# Patient Record
Sex: Female | Born: 1960 | Marital: Married | Smoking: Never smoker
Health system: Southern US, Community
[De-identification: ages and names within clinical notes are randomized; demographics above are authoritative.]

## PROBLEM LIST (undated history)

## (undated) DIAGNOSIS — I10 Essential (primary) hypertension: Secondary | ICD-10-CM

## (undated) DIAGNOSIS — E079 Disorder of thyroid, unspecified: Secondary | ICD-10-CM

## (undated) DIAGNOSIS — D649 Anemia, unspecified: Secondary | ICD-10-CM

## (undated) DIAGNOSIS — E7800 Pure hypercholesterolemia, unspecified: Secondary | ICD-10-CM

## (undated) HISTORY — DX: Anemia, unspecified: D64.9

## (undated) HISTORY — DX: Essential (primary) hypertension: I10

## (undated) HISTORY — DX: Disorder of thyroid, unspecified: E07.9

## (undated) HISTORY — DX: Pure hypercholesterolemia, unspecified: E78.00

---

## 1986-12-13 HISTORY — PX: TUBAL LIGATION: SHX77

## 2014-02-25 DIAGNOSIS — E039 Hypothyroidism, unspecified: Secondary | ICD-10-CM | POA: Insufficient documentation

## 2014-02-25 DIAGNOSIS — E559 Vitamin D deficiency, unspecified: Secondary | ICD-10-CM | POA: Insufficient documentation

## 2014-02-25 DIAGNOSIS — E7800 Pure hypercholesterolemia, unspecified: Secondary | ICD-10-CM | POA: Insufficient documentation

## 2014-02-25 DIAGNOSIS — D509 Iron deficiency anemia, unspecified: Secondary | ICD-10-CM | POA: Insufficient documentation

## 2014-02-25 DIAGNOSIS — J45909 Unspecified asthma, uncomplicated: Secondary | ICD-10-CM | POA: Insufficient documentation

## 2014-02-25 DIAGNOSIS — I10 Essential (primary) hypertension: Secondary | ICD-10-CM | POA: Insufficient documentation

## 2014-07-29 DIAGNOSIS — R42 Dizziness and giddiness: Secondary | ICD-10-CM | POA: Insufficient documentation

## 2015-08-26 DIAGNOSIS — I951 Orthostatic hypotension: Secondary | ICD-10-CM | POA: Insufficient documentation

## 2016-04-20 DIAGNOSIS — M542 Cervicalgia: Secondary | ICD-10-CM | POA: Insufficient documentation

## 2016-05-03 DIAGNOSIS — M5030 Other cervical disc degeneration, unspecified cervical region: Secondary | ICD-10-CM | POA: Insufficient documentation

## 2016-11-02 DIAGNOSIS — N9089 Other specified noninflammatory disorders of vulva and perineum: Secondary | ICD-10-CM | POA: Insufficient documentation

## 2016-11-02 DIAGNOSIS — L739 Follicular disorder, unspecified: Secondary | ICD-10-CM | POA: Insufficient documentation

## 2016-11-02 DIAGNOSIS — R2689 Other abnormalities of gait and mobility: Secondary | ICD-10-CM | POA: Insufficient documentation

## 2019-08-22 ENCOUNTER — Other Ambulatory Visit: Payer: Self-pay | Admitting: Physician Assistant

## 2019-08-22 DIAGNOSIS — Z13820 Encounter for screening for osteoporosis: Secondary | ICD-10-CM

## 2019-08-22 DIAGNOSIS — Z1231 Encounter for screening mammogram for malignant neoplasm of breast: Secondary | ICD-10-CM

## 2019-10-02 ENCOUNTER — Ambulatory Visit
Admission: RE | Admit: 2019-10-02 | Discharge: 2019-10-02 | Disposition: A | Discharge status: Home / Self Care | Payer: Commercial Managed Care - PPO | Source: Ambulatory Visit | Attending: Physician Assistant | Admitting: Physician Assistant

## 2019-10-02 ENCOUNTER — Other Ambulatory Visit: Payer: Self-pay | Admitting: Physician Assistant

## 2019-10-02 DIAGNOSIS — Z13820 Encounter for screening for osteoporosis: Secondary | ICD-10-CM

## 2019-10-02 DIAGNOSIS — Z1231 Encounter for screening mammogram for malignant neoplasm of breast: Secondary | ICD-10-CM | POA: Diagnosis present

## 2019-10-04 ENCOUNTER — Inpatient Hospital Stay
Admission: RE | Admit: 2019-10-04 | Discharge: 2019-10-04 | Disposition: A | Discharge status: Home / Self Care | Payer: Self-pay | Source: Ambulatory Visit | Attending: *Deleted | Admitting: *Deleted

## 2019-10-04 ENCOUNTER — Other Ambulatory Visit: Payer: Self-pay | Admitting: Physician Assistant

## 2019-10-04 ENCOUNTER — Other Ambulatory Visit: Payer: Self-pay | Admitting: *Deleted

## 2019-10-04 DIAGNOSIS — Z1231 Encounter for screening mammogram for malignant neoplasm of breast: Secondary | ICD-10-CM

## 2019-10-04 DIAGNOSIS — N6489 Other specified disorders of breast: Secondary | ICD-10-CM

## 2019-10-04 DIAGNOSIS — R928 Other abnormal and inconclusive findings on diagnostic imaging of breast: Secondary | ICD-10-CM

## 2019-10-17 ENCOUNTER — Ambulatory Visit
Admission: RE | Admit: 2019-10-17 | Discharge: 2019-10-17 | Disposition: A | Discharge status: Home / Self Care | Payer: Commercial Managed Care - PPO | Source: Ambulatory Visit | Attending: Physician Assistant | Admitting: Physician Assistant

## 2019-10-17 DIAGNOSIS — N6489 Other specified disorders of breast: Secondary | ICD-10-CM | POA: Diagnosis present

## 2019-10-17 DIAGNOSIS — R928 Other abnormal and inconclusive findings on diagnostic imaging of breast: Secondary | ICD-10-CM | POA: Diagnosis not present

## 2019-10-19 ENCOUNTER — Ambulatory Visit: Payer: Commercial Managed Care - PPO

## 2019-10-19 ENCOUNTER — Other Ambulatory Visit: Payer: Commercial Managed Care - PPO

## 2020-02-28 ENCOUNTER — Ambulatory Visit: Payer: Commercial Managed Care - PPO | Attending: Internal Medicine

## 2020-02-28 DIAGNOSIS — Z23 Encounter for immunization: Secondary | ICD-10-CM

## 2020-02-28 NOTE — Progress Notes (Signed)
   Covid-19 Vaccination Clinic  Name:  Meredeth Mellema    MRN: [id number] DOB: 05/29/1961  02/28/2020  Ms. Haraway was observed post Covid-19 immunization for 15 minutes without incident. She was provided with Vaccine Information Sheet and instruction to access the V-Safe system.   Ms. Munns was instructed to call 911 with any severe reactions post vaccine: Marland Kitchen Difficulty breathing  . Swelling of face and throat  . A fast heartbeat  . A bad rash all over body  . Dizziness and weakness   Immunizations Administered    Name Date Dose VIS Date Route   Pfizer COVID-19 Vaccine 02/28/2020 10:52 AM 0.3 mL 11/23/2019 Intramuscular   Manufacturer: [place]   Lot: [id number]   [place]: [id number]

## 2020-03-25 ENCOUNTER — Ambulatory Visit: Payer: Commercial Managed Care - PPO | Attending: Internal Medicine

## 2020-03-25 DIAGNOSIS — Z23 Encounter for immunization: Secondary | ICD-10-CM

## 2020-03-25 NOTE — Progress Notes (Signed)
   Covid-19 Vaccination Clinic  Name:  Crystal Hunt    MRN: [id number] DOB: 07-24-1961  03/25/2020  [name] was observed post Covid-19 immunization for 15 minutes without incident. She was provided with Vaccine Information Sheet and instruction to access the V-Safe system.   [name] was instructed to call 911 with any severe reactions post vaccine: Marland Kitchen Difficulty breathing  . Swelling of face and throat  . A fast heartbeat  . A bad rash all over body  . Dizziness and weakness   Immunizations Administered    Name Date Dose VIS Date Route   Pfizer COVID-19 Vaccine 03/25/2020  8:52 AM 0.3 mL 11/23/2019 Intramuscular   Manufacturer: [place]   Lot: [id number]   [place]: [id number]

## 2020-07-18 ENCOUNTER — Other Ambulatory Visit: Payer: Self-pay

## 2020-07-21 ENCOUNTER — Other Ambulatory Visit: Payer: Self-pay

## 2020-07-21 ENCOUNTER — Ambulatory Visit (INDEPENDENT_AMBULATORY_CARE_PROVIDER_SITE_OTHER): Payer: Managed Care, Other (non HMO) | Admitting: Gastroenterology

## 2020-07-21 ENCOUNTER — Encounter: Payer: Self-pay | Admitting: Gastroenterology

## 2020-07-21 VITALS — BP 143/77 | HR 62 | Temp 97.5°F | Ht 62.0 in | Wt 131.4 lb

## 2020-07-21 DIAGNOSIS — D509 Iron deficiency anemia, unspecified: Secondary | ICD-10-CM

## 2020-07-21 DIAGNOSIS — K625 Hemorrhage of anus and rectum: Secondary | ICD-10-CM | POA: Diagnosis not present

## 2020-07-21 DIAGNOSIS — Z1211 Encounter for screening for malignant neoplasm of colon: Secondary | ICD-10-CM

## 2020-07-21 MED ORDER — NA SULFATE-K SULFATE-MG SULF 17.5-3.13-1.6 GM/177ML PO SOLN: ORAL | 0 refills | Status: AC

## 2020-07-21 NOTE — Progress Notes (Signed)
Crystal Hunt [address]  [place]  [place],  [address]  Main: [phone]  Fax: [phone]   Gastroenterology Consultation  Referring Provider:     Dayton Scrape* Primary Care Physician:  Joella Prince Reason for Consultation:    Blood in stool        HPI:    Chief Complaint  Patient presents with  . New Patient (Initial Visit)    Crystal Hunt is a 59 y.o. y/o female referred for consultation & management  by Dr. Justin Mend, Meghan H, PA-C.  Patient noted bright red blood per rectum, on toilet paper and blood streaks in the stool itself 2-3 times.  States has had previous colonoscopies, last one was over 10 years ago.  States has had 2 or 3 colonoscopies in her lifetime and only one of them small polyps were removed.  Procedure report not available.  No family history of colon cancer.  No abdominal pain or nausea vomiting.  Patient is on iron replacement.  Lab work shows microcytic anemia but looking at her labs dating back to 2012, her ferritin and iron levels do not show iron deficiency.  Past medical history: Hypertension, hypothyroidism  Prior to Admission medications   Medication Sig Start Date End Date Taking? Authorizing Provider  ergocalciferol (VITAMIN D2) 1.25 MG (50000 UT) capsule Take 1 capsule by mouth once a week. 11/18/16  Yes [provider]  ferrous gluconate (FERGON) 324 MG tablet Take 1 tablet by mouth daily with breakfast. 07/26/19 07/25/20 Yes [provider]  levothyroxine (SYNTHROID) 88 MCG tablet Take 88 mcg by mouth daily. 07/16/20  Yes [provider]  lisinopril (ZESTRIL) 10 MG tablet Take 1 tablet by mouth daily. 07/26/19  Yes [provider]  simvastatin (ZOCOR) 40 MG tablet Take 1 tablet by mouth at bedtime. 10/24/19  Yes [provider]  Na Sulfate-K Sulfate-Mg Sulf 17.5-3.13-1.6 GM/177ML SOLN At 5 PM the day before procedure take 1 bottle and 5 hours before procedure  take 1 bottle. 07/21/20   Virgel Manifold, MD    Family History  Problem Relation Age of Onset  . Breast cancer Sister      Social History   Tobacco Use  . Smoking status: Never Smoker  . Smokeless tobacco: Never Used  Substance Use Topics  . Alcohol use: Not Currently  . Drug use: Not on file    Allergies as of 07/21/2020 - Review Complete 07/21/2020  Allergen Reaction Noted  . Hydrocodone-acetaminophen Itching and Rash 06/27/2014  . Meloxicam Rash 06/27/2014  . Simvastatin  09/30/2015    Review of Systems:    All systems reviewed and negative except where noted in HPI.   Physical Exam:  BP (!) 143/77   Pulse 62   Temp (!) 97.5 F (36.4 C) (Oral)   Ht 5\' 2"  (1.575 m)   Wt 131 lb 6.4 oz (59.6 kg)   BMI 24.03 kg/m  No LMP recorded. Patient is postmenopausal. Psych:  Alert and cooperative. Normal mood and affect. General:   Alert,  Well-developed, well-nourished, pleasant and cooperative in NAD Head:  Normocephalic and atraumatic. Eyes:  Sclera clear, no icterus.   Conjunctiva pink. Ears:  Normal auditory acuity. Nose:  No deformity, discharge, or lesions. Mouth:  No deformity or lesions,oropharynx pink & moist. Neck:  Supple; no masses or thyromegaly. Abdomen:  Normal bowel sounds.  No bruits.  Soft, non-tender and non-distended without masses, hepatosplenomegaly or hernias noted.  No guarding or  rebound tenderness.    Msk:  Symmetrical without gross deformities. Good, equal movement & strength bilaterally. Pulses:  Normal pulses noted. Extremities:  No clubbing or edema.  No cyanosis. Neurologic:  Alert and oriented x3;  grossly normal neurologically. Skin:  Intact without significant lesions or rashes. No jaundice. Lymph Nodes:  No significant cervical adenopathy. Psych:  Alert and cooperative. Normal mood and affect.   Labs: Labs in [place] from June 2021 reviewed  Imaging Studies: No results found.  Assessment and Plan:   Crystal Hunt is a 59 y.o. y/o female has been referred for blood per rectum  Symptoms likely due to underlying internal hemorrhoids  However, patient is due for screening colonoscopy which would also allow [place] to evaluate for any underlying lesions, large polyps and rule out malignancy  Her ferritin and iron labs do not show iron deficiency but patient continues to have chronic microcytic anemia.  Referral to hematology for the same, patient agreeable  High fiber diet  I have discussed alternative options, risks & benefits,  which include, but are not limited to, bleeding, infection, perforation,respiratory complication & drug reaction.  The patient agrees with this plan & written consent will be obtained.     Dr Crystal Hunt  Speech recognition software was used to dictate the above note.

## 2020-07-24 ENCOUNTER — Other Ambulatory Visit: Payer: Self-pay

## 2020-07-24 DIAGNOSIS — Z1211 Encounter for screening for malignant neoplasm of colon: Secondary | ICD-10-CM

## 2020-08-04 ENCOUNTER — Other Ambulatory Visit: Payer: Self-pay

## 2020-08-04 ENCOUNTER — Encounter: Payer: Self-pay | Admitting: Oncology

## 2020-08-04 ENCOUNTER — Inpatient Hospital Stay: Payer: Managed Care, Other (non HMO) | Attending: Oncology | Admitting: Oncology

## 2020-08-04 ENCOUNTER — Inpatient Hospital Stay: Payer: Managed Care, Other (non HMO)

## 2020-08-04 VITALS — BP 123/66 | HR 60 | Temp 98.4°F | Resp 16 | Ht 62.0 in | Wt 131.7 lb

## 2020-08-04 DIAGNOSIS — Z803 Family history of malignant neoplasm of breast: Secondary | ICD-10-CM | POA: Insufficient documentation

## 2020-08-04 DIAGNOSIS — E559 Vitamin D deficiency, unspecified: Secondary | ICD-10-CM | POA: Insufficient documentation

## 2020-08-04 DIAGNOSIS — I10 Essential (primary) hypertension: Secondary | ICD-10-CM

## 2020-08-04 DIAGNOSIS — Z79899 Other long term (current) drug therapy: Secondary | ICD-10-CM | POA: Insufficient documentation

## 2020-08-04 DIAGNOSIS — D509 Iron deficiency anemia, unspecified: Secondary | ICD-10-CM | POA: Diagnosis not present

## 2020-08-04 DIAGNOSIS — E039 Hypothyroidism, unspecified: Secondary | ICD-10-CM | POA: Diagnosis not present

## 2020-08-04 LAB — CBC WITH DIFFERENTIAL/PLATELET
Abs Immature Granulocytes: 0.02 10*3/uL (ref 0.00–0.07)
Basophils Absolute: 0 10*3/uL (ref 0.0–0.1)
Basophils Relative: 1 %
Eosinophils Absolute: 0.1 10*3/uL (ref 0.0–0.5)
Eosinophils Relative: 2 %
HCT: 38.8 % (ref 36.0–46.0)
Hemoglobin: 12 g/dL (ref 12.0–15.0)
Immature Granulocytes: 0 %
Lymphocytes Relative: 37 %
Lymphs Abs: 2 10*3/uL (ref 0.7–4.0)
MCH: 21 pg — ABNORMAL LOW (ref 26.0–34.0)
MCHC: 30.9 g/dL (ref 30.0–36.0)
MCV: 68 fL — ABNORMAL LOW (ref 80.0–100.0)
Monocytes Absolute: 0.4 10*3/uL (ref 0.1–1.0)
Monocytes Relative: 8 %
Neutro Abs: 2.8 10*3/uL (ref 1.7–7.7)
Neutrophils Relative %: 52 %
Platelets: 273 10*3/uL (ref 150–400)
RBC: 5.71 MIL/uL — ABNORMAL HIGH (ref 3.87–5.11)
RDW: 14.7 % (ref 11.5–15.5)
WBC: 5.3 10*3/uL (ref 4.0–10.5)
nRBC: 0 % (ref 0.0–0.2)

## 2020-08-04 LAB — IRON AND TIBC
Iron: 82 ug/dL (ref 28–170)
Saturation Ratios: 25 % (ref 10.4–31.8)
TIBC: 328 ug/dL (ref 250–450)
UIBC: 246 ug/dL

## 2020-08-04 LAB — VITAMIN B12: Vitamin B-12: 487 pg/mL (ref 180–914)

## 2020-08-04 LAB — FERRITIN: Ferritin: 263 ng/mL (ref 11–307)

## 2020-08-04 NOTE — Progress Notes (Signed)
Hematology/Oncology Consult note [facility] [phone] [phone]  Patient Care Team: Joella Prince as PCP - General (Physician Assistant)   Name of the patient: Crystal Hunt  [id number]  December 26, 1960    Reason for referral-microcytic anemia   Referring physician-Dr. Bonna Gains  Date of visit: 08/04/20   History of presenting illness-patient is a 59 year old female with a past medical history significant for hypothyroidism, hyperlipidemia who has been referred to [place] for anemia.  Her most recent CBC From 05/28/2020 showed white count of 5.2, H&H of 11.7/38.5 with an MCV of 68.8 and a platelet count of 295.  Looking back at her prior CBCs over the last 5 years her hemoglobin has been between 11-12 and she has had persistent microcytosis with an MCV between 68-69.  Last iron studies checked was back in 2017 when they were normal.  No family history of sickle cell disease or thalassemia.  Patient denies any bleeding in her stool or urine.  She has also seen GI and will be undergoing colonoscopy soon.    ECOG PS- 0  Pain scale- 0   Review of systems- Review of Systems  Constitutional: Positive for malaise/fatigue. Negative for chills, fever and weight loss.  HENT: Negative for congestion, ear discharge and nosebleeds.   Eyes: Negative for blurred vision.  Respiratory: Negative for cough, hemoptysis, sputum production, shortness of breath and wheezing.   Cardiovascular: Negative for chest pain, palpitations, orthopnea and claudication.  Gastrointestinal: Negative for abdominal pain, blood in stool, constipation, diarrhea, heartburn, melena, nausea and vomiting.  Genitourinary: Negative for dysuria, flank pain, frequency, hematuria and urgency.  Musculoskeletal: Negative for back pain, joint pain and myalgias.  Skin: Negative for rash.  Neurological: Negative for dizziness, tingling, focal weakness, seizures, weakness and headaches.    Endo/Heme/Allergies: Does not bruise/bleed easily.  Psychiatric/Behavioral: Negative for depression and suicidal ideas. The patient does not have insomnia.     Allergies  Allergen Reactions  . Hydrocodone-Acetaminophen Itching and Rash  . Meloxicam Rash  . Ibuprofen     Passed out when she took it, she had a fever around 101  . Simvastatin     Other reaction(s): Other (See Comments) Possible cause of elevated LFTs    Patient Active Problem List   Diagnosis Date Noted  . Folliculitis 06/05/7627  . Imbalance 11/02/2016  . Labial lesion 11/02/2016  . Degenerative disc disease, cervical 05/03/2016  . Neck pain 04/20/2016  . Orthostatic hypotension 08/26/2015  . Dizziness 07/29/2014  . Acquired hypothyroidism 02/25/2014  . Asthma 02/25/2014  . Essential hypertension 02/25/2014  . Hypercholesterolemia 02/25/2014  . Iron deficiency anemia 02/25/2014  . Vitamin D deficiency 02/25/2014  . Onychomycosis due to dermatophyte 07/26/2013  . Benign neoplasm of colon 05/19/2013  . Internal hemorrhoids 05/19/2013  . Backache 04/28/2012     Past Medical History:  Diagnosis Date  . Anemia   . High cholesterol   . Hypertension   . Thyroid disease      Past Surgical History:  Procedure Laterality Date  . TUBAL LIGATION  1988   bilateral    Social History   Socioeconomic History  . Marital status: Unknown    Spouse name: Not on file  . Number of children: Not on file  . Years of education: Not on file  . Highest education level: Not on file  Occupational History  . Not on file  Tobacco Use  . Smoking status: Never Smoker  . Smokeless tobacco: Never Used  Vaping  Use  . Vaping Use: Never used  Substance and Sexual Activity  . Alcohol use: Never  . Drug use: Never  . Sexual activity: Yes  Other Topics Concern  . Not on file  Social History Narrative  . Not on file   Social Determinants of Health   Financial Resource Strain:   . Difficulty of Paying Living  Expenses: Not on file  Food Insecurity:   . Worried About Charity fundraiser in the Last Year: Not on file  . Ran Out of Food in the Last Year: Not on file  Transportation Needs:   . Lack of Transportation (Medical): Not on file  . Lack of Transportation (Non-Medical): Not on file  Physical Activity:   . Days of Exercise per Week: Not on file  . Minutes of Exercise per Session: Not on file  Stress:   . Feeling of Stress : Not on file  Social Connections:   . Frequency of Communication with Friends and Family: Not on file  . Frequency of Social Gatherings with Friends and Family: Not on file  . Attends Religious Services: Not on file  . Active Member of Clubs or Organizations: Not on file  . Attends Archivist Meetings: Not on file  . Marital Status: Not on file  Intimate Partner Violence:   . Fear of Current or Ex-Partner: Not on file  . Emotionally Abused: Not on file  . Physically Abused: Not on file  . Sexually Abused: Not on file     Family History  Problem Relation Age of Onset  . Breast cancer Sister   . Clotting disorder Sister   . Heart disease Mother   . High Cholesterol Mother   . Hypertension Mother   . Heart attack Brother   . Diabetes Daughter   . Diabetes Son      Current Outpatient Medications:  .  ergocalciferol (VITAMIN D2) 1.25 MG (50000 UT) capsule, Take 1 capsule by mouth once a week., Disp: , Rfl:  .  ferrous gluconate (FERGON) 324 MG tablet, Take 1 tablet by mouth 3 (three) times a week. , Disp: , Rfl:  .  levothyroxine (SYNTHROID) 88 MCG tablet, Take 88 mcg by mouth daily., Disp: , Rfl:  .  lisinopril (ZESTRIL) 10 MG tablet, Take 1 tablet by mouth daily., Disp: , Rfl:  .  simvastatin (ZOCOR) 40 MG tablet, Take 1 tablet by mouth at bedtime., Disp: , Rfl:  .  Na Sulfate-K Sulfate-Mg Sulf 17.5-3.13-1.6 GM/177ML SOLN, At 5 PM the day before procedure take 1 bottle and 5 hours before procedure take 1 bottle. (Patient not taking: Reported on  08/04/2020), Disp: 354 mL, Rfl: 0   Physical exam:  Vitals:   08/04/20 0959  BP: 123/66  Pulse: 60  Resp: 16  Temp: 98.4 F (36.9 C)  TempSrc: Oral  Weight: 131 lb 11.2 oz (59.7 kg)  Height: 5\' 2"  (1.575 m)   Physical Exam Constitutional:      General: She is not in acute distress. Cardiovascular:     Rate and Rhythm: Normal rate and regular rhythm.     Heart sounds: Normal heart sounds.  Pulmonary:     Effort: Pulmonary effort is normal.     Breath sounds: Normal breath sounds.  Abdominal:     General: Bowel sounds are normal.     Palpations: Abdomen is soft.  Skin:    General: Skin is warm and dry.  Neurological:     Mental Status: She  is alert and oriented to person, place, and time.      Assessment and plan- Patient is a 59 y.o. female referred for microcytic anemia  Patient has had longstanding microcytic anemia which is relatively mild as her hemoglobin is remained stable around 11 with an MCV around 68.  Microcytic anemia could be either due to iron deficiency versus conditions like thalassemia versus idiopathic causes.  Today I will check a CBC with differential, ferritin and iron studies,Hemoglobin electrophoresis as well as B12 levels.  I will see her back in 1 to 2 weeks time for a video visit to discuss the results of blood work and further management   Thank you for this kind referral and the opportunity to participate in the care of this patient   Visit Diagnosis 1. Microcytic anemia     Dr. Randa Evens, MD, MPH [facility] at [facility] [phone] 08/04/2020 1:44 PM

## 2020-08-04 NOTE — Progress Notes (Signed)
Pt shceduled for colonoscopy in next month- she had some blood in stool in June. 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 No abd. Pain , and bm is good.

## 2020-08-05 ENCOUNTER — Encounter: Admission: RE | Payer: Self-pay | Source: Home / Self Care

## 2020-08-05 ENCOUNTER — Ambulatory Visit
Admission: RE | Admit: 2020-08-05 | Payer: Managed Care, Other (non HMO) | Source: Home / Self Care | Admitting: Gastroenterology

## 2020-08-05 SURGERY — COLONOSCOPY WITH PROPOFOL
Anesthesia: Choice

## 2020-08-06 LAB — HGB FRACTIONATION CASCADE
Hgb A2: 2.3 % (ref 1.8–3.2)
Hgb A: 97.7 % (ref 96.4–98.8)
Hgb F: 0 % (ref 0.0–2.0)
Hgb S: 0 %

## 2020-08-13 ENCOUNTER — Other Ambulatory Visit
Admission: RE | Admit: 2020-08-13 | Discharge: 2020-08-13 | Disposition: A | Discharge status: Home / Self Care | Payer: Managed Care, Other (non HMO) | Source: Ambulatory Visit | Attending: Gastroenterology | Admitting: Gastroenterology

## 2020-08-13 ENCOUNTER — Other Ambulatory Visit: Payer: Self-pay

## 2020-08-13 DIAGNOSIS — Z01812 Encounter for preprocedural laboratory examination: Secondary | ICD-10-CM | POA: Insufficient documentation

## 2020-08-13 DIAGNOSIS — Z20822 Contact with and (suspected) exposure to covid-19: Secondary | ICD-10-CM | POA: Diagnosis not present

## 2020-08-13 LAB — SARS CORONAVIRUS 2 (TAT 6-24 HRS): SARS Coronavirus 2: NEGATIVE

## 2020-08-15 ENCOUNTER — Ambulatory Visit: Payer: Managed Care, Other (non HMO) | Admitting: Anesthesiology

## 2020-08-15 ENCOUNTER — Other Ambulatory Visit: Payer: Self-pay

## 2020-08-15 ENCOUNTER — Ambulatory Visit
Admission: RE | Admit: 2020-08-15 | Discharge: 2020-08-15 | Disposition: A | Discharge status: Home / Self Care | Payer: Managed Care, Other (non HMO) | Attending: Gastroenterology | Admitting: Gastroenterology

## 2020-08-15 ENCOUNTER — Encounter: Payer: Self-pay | Admitting: Gastroenterology

## 2020-08-15 ENCOUNTER — Encounter
Admission: RE | Disposition: A | Discharge status: Home / Self Care | Payer: Self-pay | Source: Home / Self Care | Attending: Gastroenterology

## 2020-08-15 DIAGNOSIS — Z8249 Family history of ischemic heart disease and other diseases of the circulatory system: Secondary | ICD-10-CM | POA: Insufficient documentation

## 2020-08-15 DIAGNOSIS — Z1211 Encounter for screening for malignant neoplasm of colon: Secondary | ICD-10-CM | POA: Diagnosis present

## 2020-08-15 DIAGNOSIS — Z885 Allergy status to narcotic agent status: Secondary | ICD-10-CM | POA: Insufficient documentation

## 2020-08-15 DIAGNOSIS — D125 Benign neoplasm of sigmoid colon: Secondary | ICD-10-CM | POA: Insufficient documentation

## 2020-08-15 DIAGNOSIS — D649 Anemia, unspecified: Secondary | ICD-10-CM | POA: Insufficient documentation

## 2020-08-15 DIAGNOSIS — Z79890 Hormone replacement therapy (postmenopausal): Secondary | ICD-10-CM | POA: Diagnosis not present

## 2020-08-15 DIAGNOSIS — Z8349 Family history of other endocrine, nutritional and metabolic diseases: Secondary | ICD-10-CM | POA: Insufficient documentation

## 2020-08-15 DIAGNOSIS — I10 Essential (primary) hypertension: Secondary | ICD-10-CM | POA: Diagnosis not present

## 2020-08-15 DIAGNOSIS — K649 Unspecified hemorrhoids: Secondary | ICD-10-CM | POA: Insufficient documentation

## 2020-08-15 DIAGNOSIS — Z888 Allergy status to other drugs, medicaments and biological substances status: Secondary | ICD-10-CM | POA: Insufficient documentation

## 2020-08-15 DIAGNOSIS — K635 Polyp of colon: Secondary | ICD-10-CM

## 2020-08-15 DIAGNOSIS — Z886 Allergy status to analgesic agent status: Secondary | ICD-10-CM | POA: Insufficient documentation

## 2020-08-15 DIAGNOSIS — E079 Disorder of thyroid, unspecified: Secondary | ICD-10-CM | POA: Insufficient documentation

## 2020-08-15 DIAGNOSIS — Z79899 Other long term (current) drug therapy: Secondary | ICD-10-CM | POA: Diagnosis not present

## 2020-08-15 DIAGNOSIS — E7800 Pure hypercholesterolemia, unspecified: Secondary | ICD-10-CM | POA: Diagnosis not present

## 2020-08-15 HISTORY — PX: COLONOSCOPY WITH PROPOFOL: SHX5780

## 2020-08-15 SURGERY — COLONOSCOPY WITH PROPOFOL
Anesthesia: General

## 2020-08-15 MED ORDER — PROPOFOL 10 MG/ML IV BOLUS
INTRAVENOUS | Status: DC | PRN
  Administered 2020-08-15: 50 mg via INTRAVENOUS
  Administered 2020-08-15: 25 mg via INTRAVENOUS
  Administered 2020-08-15: 50 mg via INTRAVENOUS

## 2020-08-15 MED ORDER — LIDOCAINE HCL (PF) 2 % IJ SOLN
INTRAMUSCULAR | Status: AC
  Filled 2020-08-15: qty 5

## 2020-08-15 MED ORDER — PROPOFOL 500 MG/50ML IV EMUL
INTRAVENOUS | Status: DC | PRN
  Administered 2020-08-15: 125 ug/kg/min via INTRAVENOUS

## 2020-08-15 MED ORDER — PROPOFOL 500 MG/50ML IV EMUL
INTRAVENOUS | Status: AC
  Filled 2020-08-15: qty 50

## 2020-08-15 MED ORDER — SODIUM CHLORIDE 0.9 % IV SOLN: INTRAVENOUS | Status: DC

## 2020-08-15 MED ORDER — LIDOCAINE HCL (CARDIAC) PF 100 MG/5ML IV SOSY
PREFILLED_SYRINGE | INTRAVENOUS | Status: DC | PRN
  Administered 2020-08-15: 20 mg via INTRAVENOUS

## 2020-08-15 NOTE — H&P (Signed)
Vonda Antigua, MD [address], [place], [place], [place], [address] [address], [place], [place], [place], [address] Phone: [phone]  Fax: [phone]  Primary Care Physician:  Curt Jews, PA-C   Pre-Procedure History & Physical: HPI:  Crystal Hunt is a 59 y.o. female is here for a colonoscopy.   Past Medical History:  Diagnosis Date  . Anemia   . High cholesterol   . Hypertension   . Thyroid disease     Past Surgical History:  Procedure Laterality Date  . TUBAL LIGATION  1988   bilateral    Prior to Admission medications   Medication Sig Start Date End Date Taking? Authorizing Provider  levothyroxine (SYNTHROID) 88 MCG tablet Take 88 mcg by mouth daily. 07/16/20  Yes [provider]  lisinopril (ZESTRIL) 10 MG tablet Take 1 tablet by mouth daily. 07/26/19  Yes [provider]  simvastatin (ZOCOR) 40 MG tablet Take 1 tablet by mouth at bedtime. 10/24/19  Yes [provider]  ergocalciferol (VITAMIN D2) 1.25 MG (50000 UT) capsule Take 1 capsule by mouth once a week. 11/18/16   [provider]  ferrous gluconate (FERGON) 324 MG tablet Take 1 tablet by mouth 3 (three) times a week.  07/26/19 08/04/20  [provider]  Na Sulfate-K Sulfate-Mg Sulf 17.5-3.13-1.6 GM/177ML SOLN At 5 PM the day before procedure take 1 bottle and 5 hours before procedure take 1 bottle. Patient not taking: Reported on 08/04/2020 07/21/20   Virgel Manifold, MD    Allergies as of 07/24/2020 - Review Complete 07/21/2020  Allergen Reaction Noted  . Hydrocodone-acetaminophen Itching and Rash 06/27/2014  . Meloxicam Rash 06/27/2014  . Simvastatin  09/30/2015    Family History  Problem Relation Age of Onset  . Breast cancer Sister   . Clotting disorder Sister   . Heart disease Mother   . High Cholesterol Mother   . Hypertension Mother   . Heart attack Brother   . Diabetes Daughter   . Diabetes Son     Social History    Socioeconomic History  . Marital status: Married    Spouse name: Not on file  . Number of children: Not on file  . Years of education: Not on file  . Highest education level: Not on file  Occupational History  . Not on file  Tobacco Use  . Smoking status: Never Smoker  . Smokeless tobacco: Never Used  Vaping Use  . Vaping Use: Never used  Substance and Sexual Activity  . Alcohol use: Never  . Drug use: Never  . Sexual activity: Yes  Other Topics Concern  . Not on file  Social History Narrative  . Not on file   Social Determinants of Health   Financial Resource Strain:   . Difficulty of Paying Living Expenses: Not on file  Food Insecurity:   . Worried About Charity fundraiser in the Last Year: Not on file  . Ran Out of Food in the Last Year: Not on file  Transportation Needs:   . Lack of Transportation (Medical): Not on file  . Lack of Transportation (Non-Medical): Not on file  Physical Activity:   . Days of Exercise per Week: Not on file  . Minutes of Exercise per Session: Not on file  Stress:   . Feeling of Stress : Not on file  Social Connections:   . Frequency of Communication with Friends and Family: Not on file  . Frequency of Social Gatherings with Friends  and Family: Not on file  . Attends Religious Services: Not on file  . Active Member of Clubs or Organizations: Not on file  . Attends Archivist Meetings: Not on file  . Marital Status: Not on file  Intimate Partner Violence:   . Fear of Current or Ex-Partner: Not on file  . Emotionally Abused: Not on file  . Physically Abused: Not on file  . Sexually Abused: Not on file    Review of Systems: See HPI, otherwise negative ROS  Physical Exam: BP (!) 144/71   Pulse (!) 58   Temp 97.9 F (36.6 C) (Temporal)   Resp 16   Ht 5\' 2"  (1.575 m)   Wt 58.1 kg   SpO2 100%   BMI 23.41 kg/m  General:   Alert,  pleasant and cooperative in NAD Head:  Normocephalic and atraumatic. Neck:  Supple;  no masses or thyromegaly. Lungs:  Clear throughout to auscultation, normal respiratory effort.    Heart:  +S1, +S2, Regular rate and rhythm, No edema. Abdomen:  Soft, nontender and nondistended. Normal bowel sounds, without guarding, and without rebound.   Neurologic:  Alert and  oriented x4;  grossly normal neurologically.  Impression/Plan: Crystal Hunt is here for a colonoscopy to be performed for average risk screening.  Risks, benefits, limitations, and alternatives regarding  colonoscopy have been reviewed with the patient.  Questions have been answered.  All parties agreeable.   Virgel Manifold, MD  08/15/2020, 10:09 AM

## 2020-08-15 NOTE — Anesthesia Postprocedure Evaluation (Signed)
Anesthesia Post Note  Patient: Crystal Hunt  Procedure(s) Performed: COLONOSCOPY WITH PROPOFOL (N/A )  Patient location during evaluation: Endoscopy Anesthesia Type: General Level of consciousness: awake and alert Pain management: pain level controlled Vital Signs Assessment: post-procedure vital signs reviewed and stable Respiratory status: spontaneous breathing and respiratory function stable Cardiovascular status: stable Anesthetic complications: no   No complications documented.   Last Vitals:  Vitals:   08/15/20 1059 08/15/20 1109  BP: 119/64 126/64  Pulse: (!) 57 (!) 50  Resp: 18 16  Temp:    SpO2: 100% 100%    Last Pain:  Vitals:   08/15/20 1109  TempSrc:   PainSc: 0-No pain                 Sojourner Behringer K

## 2020-08-15 NOTE — Anesthesia Preprocedure Evaluation (Signed)
Anesthesia Evaluation  Patient identified by MRN, date of birth, ID band Patient awake    Reviewed: Allergy & Precautions, NPO status , Patient's Chart, lab work & pertinent test results  History of Anesthesia Complications Negative for: history of anesthetic complications  Airway Mallampati: II       Dental   Pulmonary neg sleep apnea, neg COPD, Not current smoker,           Cardiovascular hypertension, Pt. on medications (-) Past MI and (-) CHF (-) dysrhythmias (-) Valvular Problems/Murmurs     Neuro/Psych neg Seizures    GI/Hepatic Neg liver ROS, neg GERD  ,  Endo/Other  neg diabetesHypothyroidism   Renal/GU negative Renal ROS     Musculoskeletal   Abdominal   Peds  Hematology  (+) anemia ,   Anesthesia Other Findings   Reproductive/Obstetrics                            Anesthesia Physical Anesthesia Plan  ASA: II  Anesthesia Plan: General   Post-op Pain Management:    Induction: Intravenous  PONV Risk Score and Plan: 3 and Propofol infusion, TIVA and Treatment may vary due to age or medical condition  Airway Management Planned: Nasal Cannula  Additional Equipment:   Intra-op Plan:   Post-operative Plan:   Informed Consent: I have reviewed the patients History and Physical, chart, labs and discussed the procedure including the risks, benefits and alternatives for the proposed anesthesia with the patient or authorized representative who has indicated his/her understanding and acceptance.       Plan Discussed with:   Anesthesia Plan Comments:         Anesthesia Quick Evaluation

## 2020-08-15 NOTE — Transfer of Care (Signed)
Immediate Anesthesia Transfer of Care Note  Patient: Crystal Hunt  Procedure(s) Performed: COLONOSCOPY WITH PROPOFOL (N/A )  Patient Location: PACU and Endoscopy Unit  Anesthesia Type:General  Level of Consciousness: awake  Airway & Oxygen Therapy: Patient Spontanous Breathing  Post-op Assessment: Report given to RN  Post vital signs: stable  Last Vitals:  Vitals Value Taken Time  BP    Temp    Pulse    Resp    SpO2      Last Pain:  Vitals:   08/15/20 0955  TempSrc: Temporal         Complications: No complications documented.

## 2020-08-15 NOTE — Op Note (Signed)
[facility] Patient Name: Crystal Hunt Procedure Date: 08/15/2020 10:16 AM MRN: [id number] Account #: [id number] Date of Birth: 08/23/1961 Admit Type: Outpatient Age: 59 Room: [facility] ENDO ROOM 2 Gender: Female Note Status: Finalized Procedure:             Colonoscopy Indications:           Screening for colorectal malignant neoplasm,                         Incidental - Diarrhea Providers:             Aleila Syverson B. Bonna Gains MD, MD Referring MD:          Forest Gleason Md, MD (Referring MD) Medicines:             Monitored Anesthesia Care Complications:         No immediate complications. Procedure:             Pre-Anesthesia Assessment:                        - ASA Grade Assessment: II - A patient with mild                         systemic disease.                        - Prior to the procedure, a History and Physical was                         performed, and patient medications, allergies and                         sensitivities were reviewed. The patient's tolerance                         of previous anesthesia was reviewed.                        - The risks and benefits of the procedure and the                         sedation options and risks were discussed with the                         patient. All questions were answered and informed                         consent was obtained.                        - Patient identification and proposed procedure were                         verified prior to the procedure by the physician, the                         nurse, the anesthesiologist, the anesthetist and the                         technician. The procedure was verified  in the                         procedure room.                        After obtaining informed consent, the colonoscope was                         passed under direct vision. Throughout the procedure,                         the patient's blood pressure, pulse, and oxygen                          saturations were monitored continuously. The                         Colonoscope was introduced through the anus and                         advanced to the the cecum, identified by appendiceal                         orifice and ileocecal valve. The colonoscopy was                         performed with ease. The patient tolerated the                         procedure well. The quality of the bowel preparation                         was fair. Findings:      Hemorrhoids were found on perianal exam.      A 6 mm polyp was found in the sigmoid colon. The polyp was sessile. The       polyp was removed with a cold snare. Resection and retrieval were       complete.      The exam was otherwise without abnormality.      The rectum, sigmoid colon, descending colon, transverse colon, ascending       colon and cecum appeared normal. Biopsies for histology were taken with       a cold forceps from the entire colon for evaluation of microscopic       colitis.      The retroflexed view of the distal rectum and anal verge was normal and       showed no anal or rectal abnormalities.      Anal papilla(e) were hypertrophied.      No additional abnormalities were found on retroflexion. Impression:            - Preparation of the colon was fair.                        - Hemorrhoids found on perianal exam.                        - One 6 mm polyp in the sigmoid colon, removed with a  cold snare. Resected and retrieved.                        - The examination was otherwise normal.                        - The rectum, sigmoid colon, descending colon,                         transverse colon, ascending colon and cecum are                         normal. Biopsied.                        - The distal rectum and anal verge are normal on                         retroflexion view.                        - Anal papilla(e) were hypertrophied. Recommendation:         - Await pathology results.                        - Discharge patient to home (with escort).                        - Advance diet as tolerated.                        - Continue present medications.                        - Repeat colonoscopy in 2 years, with 2 day prep.                        - The findings and recommendations were discussed with                         the patient.                        - The findings and recommendations were discussed with                         the patient's family.                        - Return to primary care physician as previously                         scheduled. Procedure Code(s):     --- Professional ---                        [phone], Colonoscopy, flexible; with removal of                         tumor(s), polyp(s), or other lesion(s) by snare  technique                        45380, 59, Colonoscopy, flexible; with biopsy, single                         or multiple CPT copyright 2019 American Medical Association. All rights reserved. The codes documented in this report are preliminary and upon coder review may  be revised to meet current compliance requirements.  Vonda Antigua, MD Margretta Sidle B. Bonna Gains MD, MD 08/15/2020 10:55:58 AM This report has been signed electronically. Number of Addenda: 0 Note Initiated On: 08/15/2020 10:16 AM Scope Withdrawal Time: 0 hours 10 minutes 31 seconds  Total Procedure Duration: 0 hours 15 minutes 19 seconds  Estimated Blood Loss:  Estimated blood loss: none.      [facility]

## 2020-08-17 ENCOUNTER — Encounter: Payer: Self-pay | Admitting: Gastroenterology

## 2020-08-19 ENCOUNTER — Inpatient Hospital Stay: Payer: Managed Care, Other (non HMO) | Attending: Oncology | Admitting: Oncology

## 2020-08-19 ENCOUNTER — Encounter: Payer: Self-pay | Admitting: Gastroenterology

## 2020-08-19 ENCOUNTER — Other Ambulatory Visit: Payer: Self-pay | Admitting: *Deleted

## 2020-08-19 DIAGNOSIS — D509 Iron deficiency anemia, unspecified: Secondary | ICD-10-CM | POA: Diagnosis not present

## 2020-08-19 LAB — SURGICAL PATHOLOGY

## 2020-08-22 NOTE — Progress Notes (Signed)
I connected with Crystal Hunt on 08/22/20 at 11:30 AM EDT by video enabled telemedicine visit and verified that I am speaking with the correct person using two identifiers.   I discussed the limitations, risks, security and privacy concerns of performing an evaluation and management service by telemedicine and the availability of in-person appointments. I also discussed with the patient that there may be a patient responsible charge related to this service. The patient expressed understanding and agreed to proceed.  Other persons participating in the visit and their role in the encounter:  none  Patient's location:  work Provider's location:  work  Risk analyst Complaint: Discuss results of blood work  History of present illness: patient is a 59 year old female with a past medical history significant for hypothyroidism, hyperlipidemia who has been referred to [place] for anemia.  Her most recent CBC From 05/28/2020 showed white count of 5.2, H&H of 11.7/38.5 with an MCV of 68.8 and a platelet count of 295.  Looking back at her prior CBCs over the last 5 years her hemoglobin has been between 11-12 and she has had persistent microcytosis with an MCV between 68-69.  Last iron studies checked was back in 2017 when they were normal.  No family history of sickle cell disease or thalassemia.  Patient denies any bleeding in her stool or urine.    Interval history patient reports doing well.  Denies any complaints at this time   Review of Systems  Constitutional: Negative for chills, fever, malaise/fatigue and weight loss.  HENT: Negative for congestion, ear discharge and nosebleeds.   Eyes: Negative for blurred vision.  Respiratory: Negative for cough, hemoptysis, sputum production, shortness of breath and wheezing.   Cardiovascular: Negative for chest pain, palpitations, orthopnea and claudication.  Gastrointestinal: Negative for abdominal pain, blood in stool, constipation, diarrhea, heartburn, melena,  nausea and vomiting.  Genitourinary: Negative for dysuria, flank pain, frequency, hematuria and urgency.  Musculoskeletal: Negative for back pain, joint pain and myalgias.  Skin: Negative for rash.  Neurological: Negative for dizziness, tingling, focal weakness, seizures, weakness and headaches.  Endo/Heme/Allergies: Does not bruise/bleed easily.  Psychiatric/Behavioral: Negative for depression and suicidal ideas. The patient does not have insomnia.     Allergies  Allergen Reactions  . Hydrocodone-Acetaminophen Itching and Rash  . Meloxicam Rash  . Ibuprofen     Passed out when she took it, she had a fever around 101  . Simvastatin     Other reaction(s): Other (See Comments) Possible cause of elevated LFTs    Past Medical History:  Diagnosis Date  . Anemia   . High cholesterol   . Hypertension   . Thyroid disease     Past Surgical History:  Procedure Laterality Date  . COLONOSCOPY WITH PROPOFOL N/A 08/15/2020   Procedure: COLONOSCOPY WITH PROPOFOL;  Surgeon: Virgel Manifold, MD;  Location: [facility] ENDOSCOPY;  Service: Endoscopy;  Laterality: N/A;  . TUBAL LIGATION  1988   bilateral    Social History   Socioeconomic History  . Marital status: Married    Spouse name: Not on file  . Number of children: Not on file  . Years of education: Not on file  . Highest education level: Not on file  Occupational History  . Not on file  Tobacco Use  . Smoking status: Never Smoker  . Smokeless tobacco: Never Used  Vaping Use  . Vaping Use: Never used  Substance and Sexual Activity  . Alcohol use: Never  . Drug use: Never  . Sexual activity:  Yes  Other Topics Concern  . Not on file  Social History Narrative  . Not on file   Social Determinants of Health   Financial Resource Strain:   . Difficulty of Paying Living Expenses: Not on file  Food Insecurity:   . Worried About Charity fundraiser in the Last Year: Not on file  . Ran Out of Food in the Last Year: Not on file   Transportation Needs:   . Lack of Transportation (Medical): Not on file  . Lack of Transportation (Non-Medical): Not on file  Physical Activity:   . Days of Exercise per Week: Not on file  . Minutes of Exercise per Session: Not on file  Stress:   . Feeling of Stress : Not on file  Social Connections:   . Frequency of Communication with Friends and Family: Not on file  . Frequency of Social Gatherings with Friends and Family: Not on file  . Attends Religious Services: Not on file  . Active Member of Clubs or Organizations: Not on file  . Attends Archivist Meetings: Not on file  . Marital Status: Not on file  Intimate Partner Violence:   . Fear of Current or Ex-Partner: Not on file  . Emotionally Abused: Not on file  . Physically Abused: Not on file  . Sexually Abused: Not on file    Family History  Problem Relation Age of Onset  . Breast cancer Sister   . Clotting disorder Sister   . Heart disease Mother   . High Cholesterol Mother   . Hypertension Mother   . Heart attack Brother   . Diabetes Daughter   . Diabetes Son      Current Outpatient Medications:  .  ergocalciferol (VITAMIN D2) 1.25 MG (50000 UT) capsule, Take 1 capsule by mouth once a week., Disp: , Rfl:  .  levothyroxine (SYNTHROID) 88 MCG tablet, Take 88 mcg by mouth daily., Disp: , Rfl:  .  lisinopril (ZESTRIL) 10 MG tablet, Take 1 tablet by mouth daily., Disp: , Rfl:  .  Na Sulfate-K Sulfate-Mg Sulf 17.5-3.13-1.6 GM/177ML SOLN, At 5 PM the day before procedure take 1 bottle and 5 hours before procedure take 1 bottle., Disp: 354 mL, Rfl: 0 .  ferrous gluconate (FERGON) 324 MG tablet, Take 1 tablet by mouth 3 (three) times a week. , Disp: , Rfl:  .  simvastatin (ZOCOR) 40 MG tablet, Take 1 tablet by mouth at bedtime. (Patient not taking: Reported on 08/19/2020), Disp: , Rfl:   No results found.  No images are attached to the encounter.   No flowsheet data found. CBC Latest Ref Rng & Units  08/04/2020  WBC 4.0 - 10.5 K/uL 5.3  Hemoglobin 12.0 - 15.0 g/dL 12.0  Hematocrit 36 - 46 % 38.8  Platelets 150 - 400 K/uL 273     Observation/objective: Appears in no acute distress over video visit today.  Breathing is nonlabored  Assessment and plan: Patient is a 59 year old female referred for microcytosis without significant anemia  I discussed the results of blood work with the patient which showed hemoglobin that has remained stable around 12 but MCV is 68 and has been this way for more than 7 years.  Ferritin and iron studies are normal and hemoglobin electrophoresis did not reveal any evidence of hemoglobinopathy.  Because of her microcytosis is unclear.  However she does not warrant a bone marrow biopsy at this time given that her CBC remained stable between 11-12 at least  since 2012.   Follow-up instructions: Repeat CBC with differential in 6 months in 1 year and I will see her back in 1 year  I discussed the assessment and treatment plan with the patient. The patient was provided an opportunity to ask questions and all were answered. The patient agreed with the plan and demonstrated an understanding of the instructions.   The patient was advised to call back or seek an in-person evaluation if the symptoms worsen or if the condition fails to improve as anticipated.   Visit Diagnosis: 1. Microcytic anemia     Dr. Randa Evens, MD, MPH [facility] Tel- [phone] 08/22/2020 8:45 AM

## 2021-02-11 ENCOUNTER — Other Ambulatory Visit: Payer: Self-pay | Admitting: Internal Medicine

## 2021-02-11 DIAGNOSIS — Z1231 Encounter for screening mammogram for malignant neoplasm of breast: Secondary | ICD-10-CM

## 2021-02-16 ENCOUNTER — Inpatient Hospital Stay: Payer: 59 | Attending: Oncology

## 2021-03-11 ENCOUNTER — Ambulatory Visit
Admission: RE | Admit: 2021-03-11 | Discharge: 2021-03-11 | Disposition: A | Discharge status: Home / Self Care | Payer: 59 | Source: Ambulatory Visit | Attending: Internal Medicine | Admitting: Internal Medicine

## 2021-03-11 ENCOUNTER — Other Ambulatory Visit: Payer: Self-pay

## 2021-03-11 DIAGNOSIS — Z1231 Encounter for screening mammogram for malignant neoplasm of breast: Secondary | ICD-10-CM | POA: Insufficient documentation

## 2021-08-19 ENCOUNTER — Inpatient Hospital Stay: Payer: 59 | Attending: Oncology

## 2021-08-19 DIAGNOSIS — D509 Iron deficiency anemia, unspecified: Secondary | ICD-10-CM

## 2021-08-19 DIAGNOSIS — D649 Anemia, unspecified: Secondary | ICD-10-CM | POA: Insufficient documentation

## 2021-08-19 LAB — CBC WITH DIFFERENTIAL/PLATELET
Abs Immature Granulocytes: 0.02 10*3/uL (ref 0.00–0.07)
Basophils Absolute: 0 10*3/uL (ref 0.0–0.1)
Basophils Relative: 0 %
Eosinophils Absolute: 0.1 10*3/uL (ref 0.0–0.5)
Eosinophils Relative: 2 %
HCT: 37.7 % (ref 36.0–46.0)
Hemoglobin: 11.6 g/dL — ABNORMAL LOW (ref 12.0–15.0)
Immature Granulocytes: 0 %
Lymphocytes Relative: 39 %
Lymphs Abs: 2.2 10*3/uL (ref 0.7–4.0)
MCH: 21.4 pg — ABNORMAL LOW (ref 26.0–34.0)
MCHC: 30.8 g/dL (ref 30.0–36.0)
MCV: 69.7 fL — ABNORMAL LOW (ref 80.0–100.0)
Monocytes Absolute: 0.4 10*3/uL (ref 0.1–1.0)
Monocytes Relative: 6 %
Neutro Abs: 2.9 10*3/uL (ref 1.7–7.7)
Neutrophils Relative %: 53 %
Platelets: 264 10*3/uL (ref 150–400)
RBC: 5.41 MIL/uL — ABNORMAL HIGH (ref 3.87–5.11)
RDW: 14.7 % (ref 11.5–15.5)
WBC: 5.6 10*3/uL (ref 4.0–10.5)
nRBC: 0 % (ref 0.0–0.2)

## 2021-08-21 ENCOUNTER — Inpatient Hospital Stay: Payer: 59 | Admitting: Oncology

## 2021-08-24 ENCOUNTER — Inpatient Hospital Stay (HOSPITAL_BASED_OUTPATIENT_CLINIC_OR_DEPARTMENT_OTHER): Payer: 59 | Admitting: Nurse Practitioner

## 2021-08-24 ENCOUNTER — Encounter: Payer: Self-pay | Admitting: Nurse Practitioner

## 2021-08-24 NOTE — Progress Notes (Signed)
Patient denies new problems/concerns today.   °

## 2021-08-24 NOTE — Progress Notes (Signed)
Patient did not answer for virtual visit. Disregard.

## 2022-02-15 ENCOUNTER — Telehealth: Payer: Self-pay

## 2022-02-15 ENCOUNTER — Other Ambulatory Visit: Payer: Self-pay | Admitting: Internal Medicine

## 2022-02-15 ENCOUNTER — Telehealth: Payer: Self-pay | Admitting: Gastroenterology

## 2022-02-15 DIAGNOSIS — Z1231 Encounter for screening mammogram for malignant neoplasm of breast: Secondary | ICD-10-CM

## 2022-02-15 NOTE — Telephone Encounter (Signed)
CALLED PATIENT NO ANSWER LEFT VOICEMAIL FOR A CALL BACK ?Returning her call  ?

## 2022-02-15 NOTE — Telephone Encounter (Signed)
Patient needs an appt scheduled for a repeat colonoscopy.  ?

## 2023-01-19 DIAGNOSIS — K625 Hemorrhage of anus and rectum: Secondary | ICD-10-CM | POA: Diagnosis not present

## 2023-01-19 DIAGNOSIS — E039 Hypothyroidism, unspecified: Secondary | ICD-10-CM | POA: Diagnosis not present

## 2023-01-19 DIAGNOSIS — M810 Age-related osteoporosis without current pathological fracture: Secondary | ICD-10-CM | POA: Diagnosis not present

## 2023-01-19 DIAGNOSIS — Z1211 Encounter for screening for malignant neoplasm of colon: Secondary | ICD-10-CM | POA: Diagnosis not present

## 2023-01-19 DIAGNOSIS — R062 Wheezing: Secondary | ICD-10-CM | POA: Diagnosis not present

## 2023-01-19 DIAGNOSIS — M722 Plantar fascial fibromatosis: Secondary | ICD-10-CM | POA: Diagnosis not present

## 2023-01-19 DIAGNOSIS — I10 Essential (primary) hypertension: Secondary | ICD-10-CM | POA: Diagnosis not present

## 2023-02-02 ENCOUNTER — Other Ambulatory Visit: Payer: Self-pay | Admitting: Internal Medicine

## 2023-02-02 DIAGNOSIS — Z1231 Encounter for screening mammogram for malignant neoplasm of breast: Secondary | ICD-10-CM

## 2023-02-10 ENCOUNTER — Other Ambulatory Visit: Payer: Self-pay | Admitting: Internal Medicine

## 2023-02-10 ENCOUNTER — Other Ambulatory Visit (HOSPITAL_COMMUNITY): Payer: Self-pay | Admitting: Internal Medicine

## 2023-02-10 ENCOUNTER — Encounter (HOSPITAL_COMMUNITY): Payer: Self-pay

## 2023-02-10 ENCOUNTER — Ambulatory Visit (HOSPITAL_COMMUNITY): Payer: 59

## 2023-02-10 ENCOUNTER — Ambulatory Visit
Admission: RE | Admit: 2023-02-10 | Discharge: 2023-02-10 | Disposition: A | Discharge status: Home / Self Care | Payer: 59 | Source: Ambulatory Visit | Attending: Internal Medicine | Admitting: Internal Medicine

## 2023-02-10 DIAGNOSIS — R197 Diarrhea, unspecified: Secondary | ICD-10-CM | POA: Diagnosis not present

## 2023-02-10 DIAGNOSIS — K3189 Other diseases of stomach and duodenum: Secondary | ICD-10-CM | POA: Diagnosis not present

## 2023-02-10 DIAGNOSIS — K5792 Diverticulitis of intestine, part unspecified, without perforation or abscess without bleeding: Secondary | ICD-10-CM

## 2023-02-10 DIAGNOSIS — K219 Gastro-esophageal reflux disease without esophagitis: Secondary | ICD-10-CM | POA: Diagnosis not present

## 2023-02-10 DIAGNOSIS — E039 Hypothyroidism, unspecified: Secondary | ICD-10-CM | POA: Diagnosis not present

## 2023-02-10 DIAGNOSIS — M810 Age-related osteoporosis without current pathological fracture: Secondary | ICD-10-CM | POA: Diagnosis not present

## 2023-02-10 DIAGNOSIS — K6389 Other specified diseases of intestine: Secondary | ICD-10-CM | POA: Diagnosis not present

## 2023-02-10 DIAGNOSIS — I10 Essential (primary) hypertension: Secondary | ICD-10-CM | POA: Diagnosis not present

## 2023-02-10 MED ORDER — IOPAMIDOL (ISOVUE-370) INJECTION 76%
80.0000 mL | Freq: Once | INTRAVENOUS | Status: AC | PRN
  Administered 2023-02-10: 80 mL via INTRAVENOUS

## 2023-02-24 IMAGING — MG MM DIGITAL SCREENING BILAT W/ TOMO AND CAD
8 series · 8 of 24 positions shown · non-contrast
Comparison: Previous exam(s).

CLINICAL DATA: Screening.

EXAM:
DIGITAL SCREENING BILATERAL MAMMOGRAM WITH TOMOSYNTHESIS AND CAD
TECHNIQUE: Bilateral screening digital craniocaudal and mediolateral oblique
mammograms were obtained. Bilateral screening digital breast
tomosynthesis was performed. The images were evaluated with
computer-aided detection.

[L CC synth-2D]
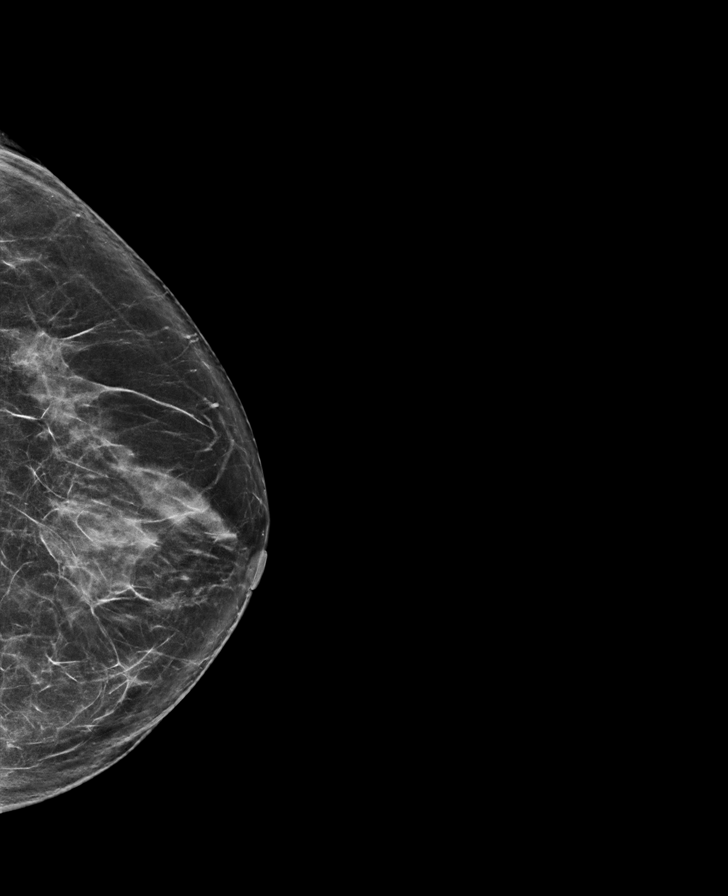

[L MLO synth-2D]
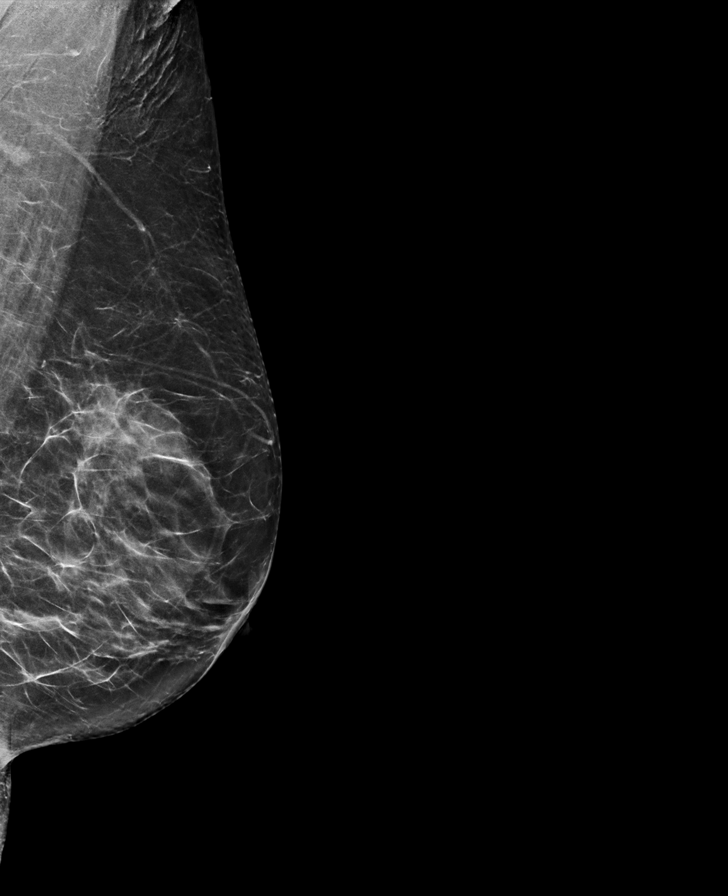

[R CC synth-2D]
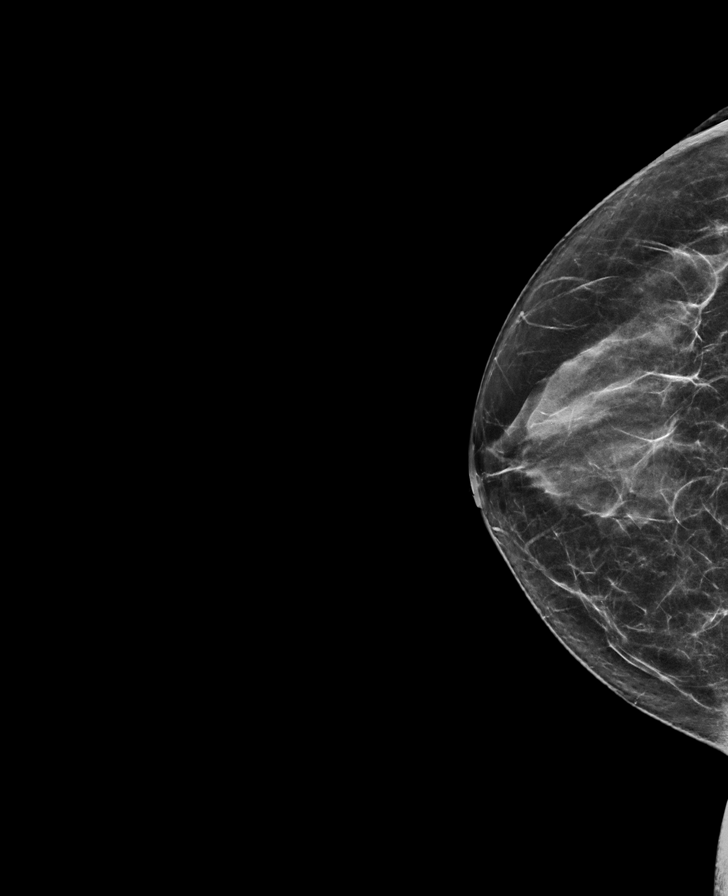

[R MLO synth-2D]
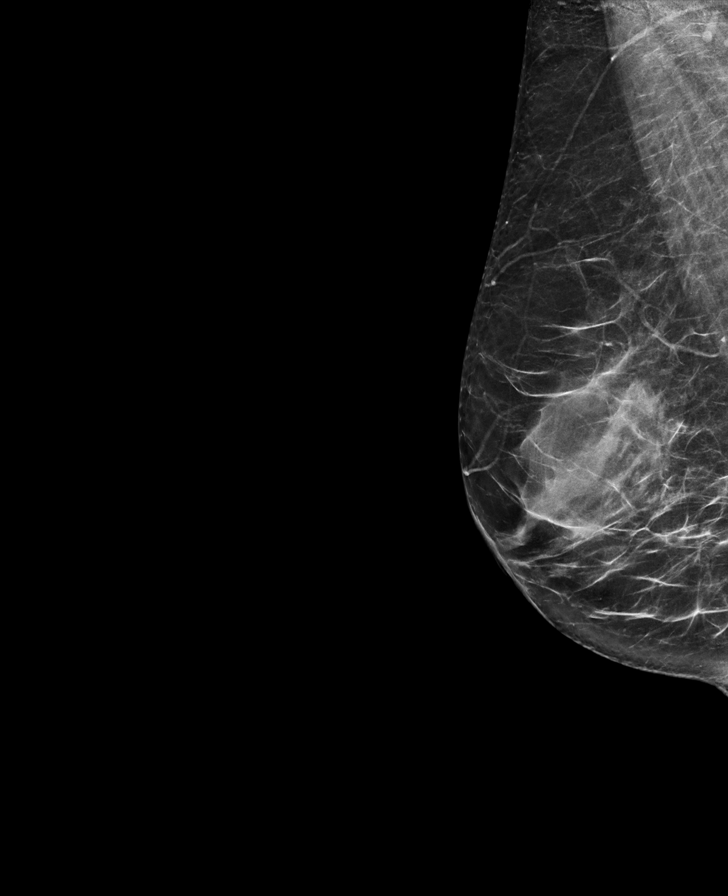

[L CC tomo · tomo slice 37/74.0]
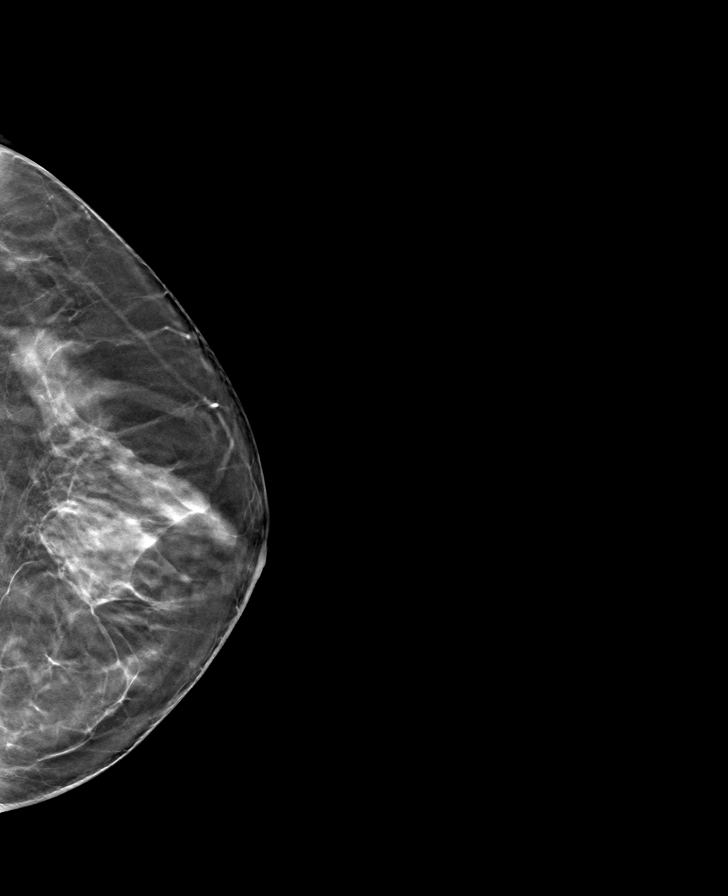

[L MLO tomo · tomo slice 36/71.0]
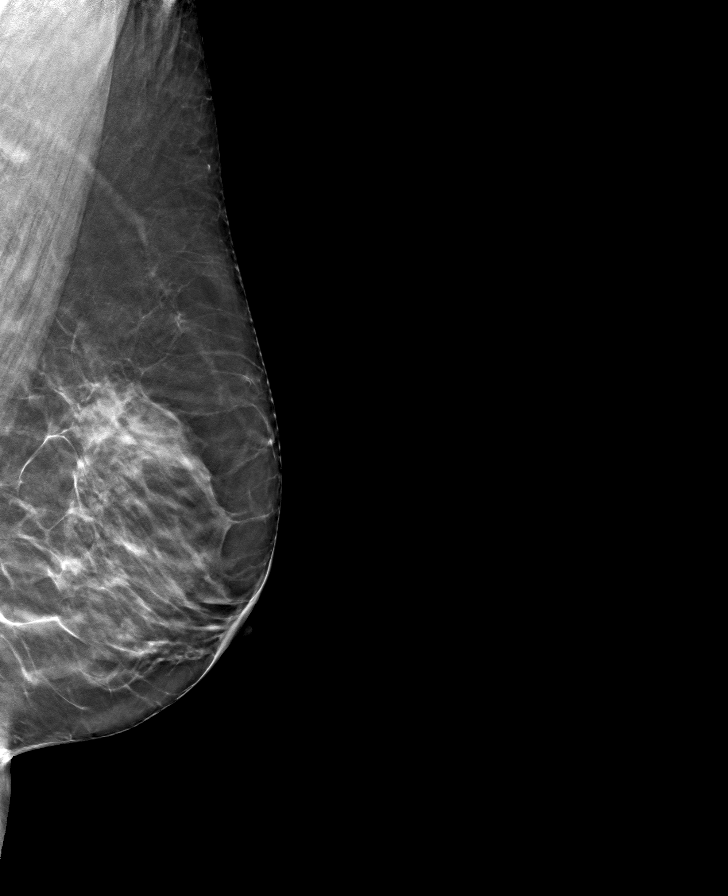

[R MLO tomo · tomo slice 35/70.0]
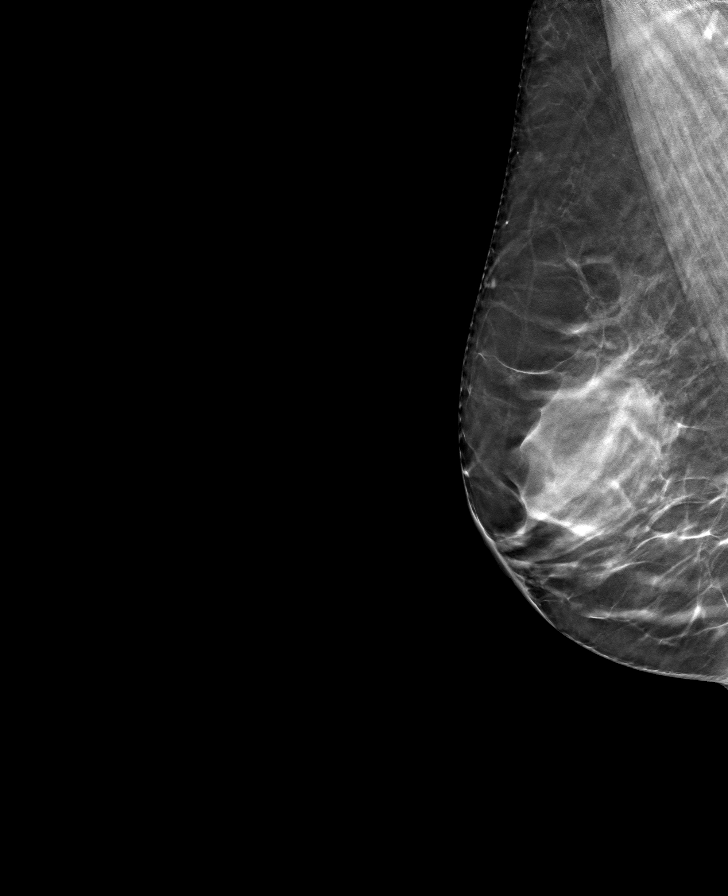

[R CC tomo · tomo slice 33/66.0]
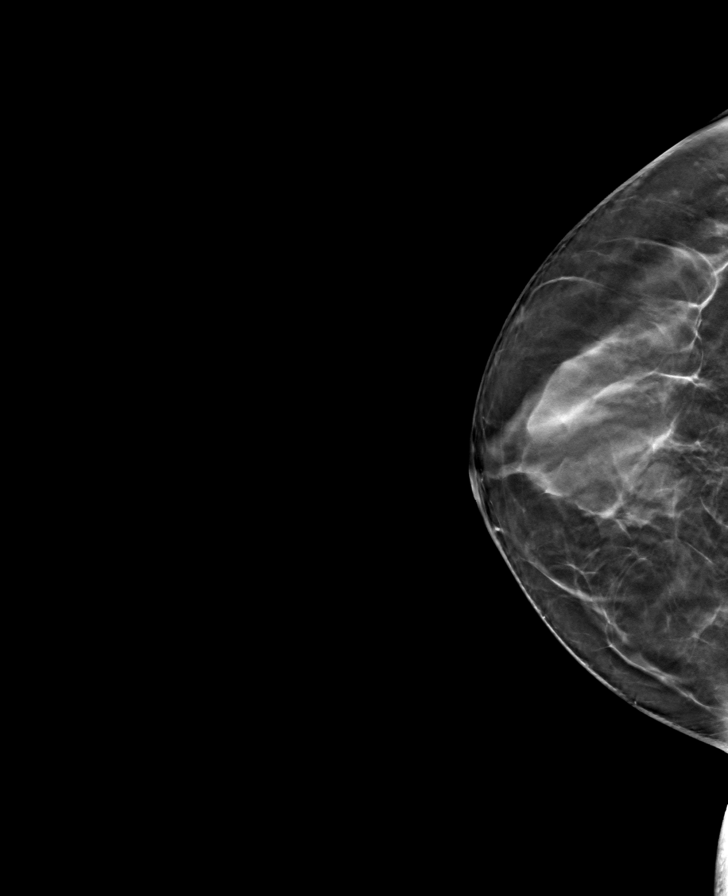

[8 of 24 positions shown; findings below may reference images not displayed]

ACR Breast Density Category c: The breast tissue is heterogeneously
dense, which may obscure small masses.
FINDINGS: There are no findings suspicious for malignancy. The images were
evaluated with computer-aided detection.
IMPRESSION: No mammographic evidence of malignancy. A result letter of this
screening mammogram will be mailed directly to the patient.

RECOMMENDATION:
Screening mammogram in one year. (Code:T4-5-GWO)

BI-RADS CATEGORY  1: Negative.

## 2023-02-25 ENCOUNTER — Ambulatory Visit
Admission: RE | Admit: 2023-02-25 | Discharge: 2023-02-25 | Disposition: A | Discharge status: Home / Self Care | Payer: 59 | Source: Ambulatory Visit | Attending: Internal Medicine | Admitting: Internal Medicine

## 2023-02-25 DIAGNOSIS — Z1231 Encounter for screening mammogram for malignant neoplasm of breast: Secondary | ICD-10-CM | POA: Insufficient documentation

## 2023-03-17 DIAGNOSIS — M810 Age-related osteoporosis without current pathological fracture: Secondary | ICD-10-CM | POA: Diagnosis not present

## 2023-03-17 DIAGNOSIS — E039 Hypothyroidism, unspecified: Secondary | ICD-10-CM | POA: Diagnosis not present

## 2023-03-17 DIAGNOSIS — I10 Essential (primary) hypertension: Secondary | ICD-10-CM | POA: Diagnosis not present

## 2023-03-17 DIAGNOSIS — R7309 Other abnormal glucose: Secondary | ICD-10-CM | POA: Diagnosis not present

## 2023-03-22 DIAGNOSIS — E039 Hypothyroidism, unspecified: Secondary | ICD-10-CM | POA: Diagnosis not present

## 2023-03-22 DIAGNOSIS — I10 Essential (primary) hypertension: Secondary | ICD-10-CM | POA: Diagnosis not present

## 2023-03-22 DIAGNOSIS — E119 Type 2 diabetes mellitus without complications: Secondary | ICD-10-CM | POA: Diagnosis not present

## 2023-03-22 DIAGNOSIS — Z0000 Encounter for general adult medical examination without abnormal findings: Secondary | ICD-10-CM | POA: Diagnosis not present

## 2023-05-03 DIAGNOSIS — K625 Hemorrhage of anus and rectum: Secondary | ICD-10-CM | POA: Diagnosis not present

## 2023-05-03 DIAGNOSIS — Z86010 Personal history of colonic polyps: Secondary | ICD-10-CM | POA: Diagnosis not present

## 2023-07-29 ENCOUNTER — Encounter: Payer: Self-pay | Admitting: Gastroenterology

## 2023-07-31 NOTE — H&P (Signed)
Pre-Procedure H&P   Patient ID: Crystal Hunt is a 62 y.o. female.  Gastroenterology Provider: Jaynie Collins, DO  Referring Provider: Tawni Pummel, PA PCP: Enid Baas, MD  Date: 08/01/2023  HPI Crystal Hunt is a 62 y.o. female who presents today for Colonoscopy for Bright red blood per rectum; personal history of colon polyps .  Patient has had 6 episodes of painless rectal bleeding since January 2024.  She denies any straining with these episodes.  She notes that is mixed with the stool.  She reports 2 bowel movements per day.  No melena.  No nocturnal awakenings  Hemoglobin 11.7 MCV 69 (chronically microcytic) platelets 271,000 A1c 6.7 ALT 50 AST 34 total bili 0.7 creatinine 0.6  Last underwent colonoscopy in 2021 with fair prep.  Internal hemorrhoids and 1 adenomatous polyp (6 mm) were noted.  Hyperplastic polyps were also reported.  Due to fair prep 2-year repeat was recommended at that time.  Random colon biopsies negative for microscopic colitis  No family history of colon cancer or colon polyps   Past Medical History:  Diagnosis Date   Anemia    High cholesterol    Hypertension    Thyroid disease     Past Surgical History:  Procedure Laterality Date   COLONOSCOPY WITH PROPOFOL N/A 08/15/2020   Procedure: COLONOSCOPY WITH PROPOFOL;  Surgeon: Pasty Spillers, MD;  Location: [facility] ENDOSCOPY;  Service: Endoscopy;  Laterality: N/A;   TUBAL LIGATION  1988   bilateral    Family History No h/o GI disease or malignancy  Review of Systems  Constitutional:  Negative for activity change, appetite change, chills, diaphoresis, fatigue, fever and unexpected weight change.  HENT:  Negative for trouble swallowing and voice change.   Respiratory:  Negative for shortness of breath and wheezing.   Cardiovascular:  Negative for chest pain, palpitations and leg swelling.  Gastrointestinal:  Positive for blood in stool. Negative for abdominal  distention, abdominal pain, anal bleeding, constipation, diarrhea, nausea, rectal pain and vomiting.  Musculoskeletal:  Negative for arthralgias and myalgias.  Skin:  Negative for color change and pallor.  Neurological:  Negative for dizziness, syncope and weakness.  Psychiatric/Behavioral:  Negative for confusion.   All other systems reviewed and are negative.    Medications No current facility-administered medications on file prior to encounter.   Current Outpatient Medications on File Prior to Encounter  Medication Sig Dispense Refill   ergocalciferol (VITAMIN D2) 1.25 MG (50000 UT) capsule Take 1 capsule by mouth once a week.     levothyroxine (SYNTHROID) 88 MCG tablet Take 88 mcg by mouth daily.     lisinopril (ZESTRIL) 10 MG tablet Take 1 tablet by mouth daily.     ferrous gluconate (FERGON) 324 MG tablet Take 1 tablet by mouth 3 (three) times a week.      ibandronate (BONIVA) 150 MG tablet Take 150 mg by mouth every 30 (thirty) days. Take in the morning with a full glass of water, on an empty stomach, and do not take anything else by mouth or lie down for the next 30 min. (Patient not taking: Reported on 08/01/2023)     Na Sulfate-K Sulfate-Mg Sulf 17.5-3.13-1.6 GM/177ML SOLN At 5 PM the day before procedure take 1 bottle and 5 hours before procedure take 1 bottle. (Patient not taking: Reported on 08/24/2021) 354 mL 0   simvastatin (ZOCOR) 40 MG tablet Take 1 tablet by mouth at bedtime. (Patient not taking: No sig reported)  Pertinent medications related to GI and procedure were reviewed by me with the patient prior to the procedure   Current Facility-Administered Medications:    0.9 %  sodium chloride infusion, , Intravenous, Continuous, Jaynie Collins, DO, Last Rate: 20 mL/hr at 08/01/23 1230, New Bag at 08/01/23 1230  sodium chloride 20 mL/hr at 08/01/23 1230       Allergies  Allergen Reactions   Hydrocodone-Acetaminophen Itching and Rash   Meloxicam Rash    Ibuprofen     Passed out when she took it, she had a fever around 101   Simvastatin     Other reaction(s): Other (See Comments) Possible cause of elevated LFTs   Allergies were reviewed by me prior to the procedure  Objective   Body mass index is 22.86 kg/m. Vitals:   08/01/23 1217  BP: 134/69  Pulse: 67  Resp: 14  Temp: (!) 96.5 F (35.8 C)  TempSrc: Tympanic  SpO2: 98%  Weight: 56.7 kg  Height: 5\' 2"  (1.575 m)    Physical Exam Vitals and nursing note reviewed.  Constitutional:      General: She is not in acute distress.    Appearance: Normal appearance. She is not ill-appearing, toxic-appearing or diaphoretic.  HENT:     Head: Normocephalic and atraumatic.     Nose: Nose normal.     Mouth/Throat:     Mouth: Mucous membranes are moist.     Pharynx: Oropharynx is clear.  Eyes:     General: No scleral icterus.    Extraocular Movements: Extraocular movements intact.  Cardiovascular:     Rate and Rhythm: Normal rate and regular rhythm.     Heart sounds: Normal heart sounds. No murmur heard.    No friction rub. No gallop.  Pulmonary:     Effort: Pulmonary effort is normal. No respiratory distress.     Breath sounds: Normal breath sounds. No wheezing, rhonchi or rales.  Abdominal:     General: Bowel sounds are normal. There is no distension.     Palpations: Abdomen is soft.     Tenderness: There is no abdominal tenderness. There is no guarding or rebound.  Musculoskeletal:     Cervical back: Neck supple.     Right lower leg: No edema.     Left lower leg: No edema.  Skin:    General: Skin is warm and dry.     Coloration: Skin is not jaundiced or pale.  Neurological:     General: No focal deficit present.     Mental Status: She is alert and oriented to person, place, and time. Mental status is at baseline.  Psychiatric:        Mood and Affect: Mood normal.        Behavior: Behavior normal.        Thought Content: Thought content normal.        Judgment:  Judgment normal.      Assessment:  Crystal Hunt is a 62 y.o. female  who presents today for Colonoscopy for Bright red blood per rectum; personal history of colon polyps .  Plan:  Colonoscopy with possible intervention today  Colonoscopy with possible biopsy, control of bleeding, polypectomy, and interventions as necessary has been discussed with the patient/patient representative. Informed consent was obtained from the patient/patient representative after explaining the indication, nature, and risks of the procedure including but not limited to death, bleeding, perforation, missed neoplasm/lesions, cardiorespiratory compromise, and reaction to medications. Opportunity for questions was given and  appropriate answers were provided. Patient/patient representative has verbalized understanding is amenable to undergoing the procedure.   Jaynie Collins, DO  [facility]astro[facility]  Portions of the record may have been created with voice recognition software. Occasional wrong-word or 'sound-a-like' substitutions may have occurred due to the inherent limitations of voice recognition software.  Read the chart carefully and recognize, using context, where substitutions may have occurred.

## 2023-08-01 ENCOUNTER — Ambulatory Visit
Admission: RE | Admit: 2023-08-01 | Discharge: 2023-08-01 | Disposition: A | Discharge status: Home / Self Care | Payer: 59 | Attending: Gastroenterology | Admitting: Gastroenterology

## 2023-08-01 ENCOUNTER — Encounter
Admission: RE | Disposition: A | Discharge status: Home / Self Care | Payer: Self-pay | Source: Home / Self Care | Attending: Gastroenterology

## 2023-08-01 ENCOUNTER — Ambulatory Visit: Payer: 59 | Admitting: Certified Registered"

## 2023-08-01 ENCOUNTER — Encounter: Payer: Self-pay | Admitting: Gastroenterology

## 2023-08-01 DIAGNOSIS — D649 Anemia, unspecified: Secondary | ICD-10-CM | POA: Diagnosis not present

## 2023-08-01 DIAGNOSIS — D759 Disease of blood and blood-forming organs, unspecified: Secondary | ICD-10-CM | POA: Insufficient documentation

## 2023-08-01 DIAGNOSIS — E039 Hypothyroidism, unspecified: Secondary | ICD-10-CM | POA: Insufficient documentation

## 2023-08-01 DIAGNOSIS — Z1211 Encounter for screening for malignant neoplasm of colon: Secondary | ICD-10-CM | POA: Diagnosis not present

## 2023-08-01 DIAGNOSIS — K635 Polyp of colon: Secondary | ICD-10-CM | POA: Diagnosis not present

## 2023-08-01 DIAGNOSIS — I10 Essential (primary) hypertension: Secondary | ICD-10-CM | POA: Insufficient documentation

## 2023-08-01 DIAGNOSIS — K640 First degree hemorrhoids: Secondary | ICD-10-CM | POA: Diagnosis not present

## 2023-08-01 DIAGNOSIS — K625 Hemorrhage of anus and rectum: Secondary | ICD-10-CM | POA: Diagnosis not present

## 2023-08-01 DIAGNOSIS — K6289 Other specified diseases of anus and rectum: Secondary | ICD-10-CM | POA: Insufficient documentation

## 2023-08-01 DIAGNOSIS — K649 Unspecified hemorrhoids: Secondary | ICD-10-CM | POA: Diagnosis not present

## 2023-08-01 DIAGNOSIS — Z86010 Personal history of colonic polyps: Secondary | ICD-10-CM | POA: Diagnosis not present

## 2023-08-01 DIAGNOSIS — D124 Benign neoplasm of descending colon: Secondary | ICD-10-CM | POA: Diagnosis not present

## 2023-08-01 HISTORY — PX: COLONOSCOPY WITH PROPOFOL: SHX5780

## 2023-08-01 HISTORY — PX: POLYPECTOMY: SHX5525

## 2023-08-01 SURGERY — COLONOSCOPY WITH PROPOFOL
Anesthesia: General

## 2023-08-01 MED ORDER — PROPOFOL 10 MG/ML IV BOLUS
INTRAVENOUS | Status: DC | PRN
  Administered 2023-08-01: 200 ug/kg/min via INTRAVENOUS
  Administered 2023-08-01: 50 mg via INTRAVENOUS

## 2023-08-01 MED ORDER — SODIUM CHLORIDE 0.9 % IV SOLN: INTRAVENOUS | Status: DC

## 2023-08-01 MED ORDER — LIDOCAINE HCL (CARDIAC) PF 100 MG/5ML IV SOSY
PREFILLED_SYRINGE | INTRAVENOUS | Status: DC | PRN
  Administered 2023-08-01: 100 mg via INTRAVENOUS

## 2023-08-01 NOTE — Op Note (Signed)
[facility] Patient Name: Sutton Bossard Procedure Date: 08/01/2023 1:03 PM MRN: [id number] Account #: [id number] Date of Birth: Aug 14, 1961 Admit Type: Outpatient Age: 62 Room: [facility] ENDO ROOM 2 Gender: Female Note Status: Finalized Instrument Name: Peds Colonoscope [id number] Procedure:             Colonoscopy Indications:           High risk colon cancer surveillance: Personal history                         of colonic polyps Providers:             Trenda Moots, DO Referring MD:          Jaynie Collins DO, DO (Referring MD) Medicines:             Monitored Anesthesia Care Complications:         No immediate complications. Estimated blood loss:                         Minimal. Procedure:             Pre-Anesthesia Assessment:                        - Prior to the procedure, a History and Physical was                         performed, and patient medications and allergies were                         reviewed. The patient is competent. The risks and                         benefits of the procedure and the sedation options and                         risks were discussed with the patient. All questions                         were answered and informed consent was obtained.                         Patient identification and proposed procedure were                         verified by the physician, the nurse, the anesthetist                         and the technician in the endoscopy suite. Mental                         Status Examination: alert and oriented. Airway                         Examination: normal oropharyngeal airway and neck                         mobility. Respiratory Examination: clear to  auscultation. CV Examination: RRR, no murmurs, no S3                         or S4. Prophylactic Antibiotics: The patient does not                         require prophylactic antibiotics. Prior                          Anticoagulants: The patient has taken no anticoagulant                         or antiplatelet agents. ASA Grade Assessment: III - A                         patient with severe systemic disease. After reviewing                         the risks and benefits, the patient was deemed in                         satisfactory condition to undergo the procedure. The                         anesthesia plan was to use monitored anesthesia care                         (MAC). Immediately prior to administration of                         medications, the patient was re-assessed for adequacy                         to receive sedatives. The heart rate, respiratory                         rate, oxygen saturations, blood pressure, adequacy of                         pulmonary ventilation, and response to care were                         monitored throughout the procedure. The physical                         status of the patient was re-assessed after the                         procedure.                        After obtaining informed consent, the colonoscope was                         passed under direct vision. Throughout the procedure,                         the patient's blood pressure, pulse, and oxygen  saturations were monitored continuously. The                         Colonoscope was introduced through the anus and                         advanced to the the terminal ileum, with                         identification of the appendiceal orifice and IC                         valve. The colonoscopy was performed without                         difficulty. The patient tolerated the procedure well.                         The quality of the bowel preparation was evaluated                         using the BBPS [facility] Bowel Preparation Scale) with                         scores of: Right Colon = 3, Transverse Colon = 3 and                         Left Colon  = 3 (entire mucosa seen well with no                         residual staining, small fragments of stool or opaque                         liquid). The total BBPS score equals 9. The terminal                         ileum, ileocecal valve, appendiceal orifice, and                         rectum were photographed. Findings:      The perianal and digital rectal examinations were normal. Pertinent       negatives include normal sphincter tone.      The terminal ileum appeared normal. Estimated blood loss: none.      Retroflexion in the right colon was performed.      A 5 to 6 mm polyp was found in the descending colon. The polyp was       sessile. The polyp was removed with a hot snare. Resection and retrieval       were complete. Estimated blood loss was minimal.      Non-bleeding internal hemorrhoids were found during retroflexion. The       hemorrhoids were Grade I (internal hemorrhoids that do not prolapse).       Estimated blood loss: none.      Anal papilla(e) were hypertrophied. Estimated blood loss: none.      The exam was otherwise without abnormality on direct and retroflexion       views. Impression:            -  The examined portion of the ileum was normal.                        - One 5 to 6 mm polyp in the descending colon, removed                         with a hot snare. Resected and retrieved.                        - Non-bleeding internal hemorrhoids.                        - Anal papilla(e) were hypertrophied.                        - The examination was otherwise normal on direct and                         retroflexion views. Recommendation:        - Patient has a contact number available for                         emergencies. The signs and symptoms of potential                         delayed complications were discussed with the patient.                         Return to normal activities tomorrow. Written                         discharge instructions were  provided to the patient.                        - Discharge patient to home.                        - Resume previous diet.                        - Continue present medications.                        - Await pathology results.                        - Repeat colonoscopy for surveillance based on                         pathology results.                        - No ibuprofen, naproxen, or other non-steroidal                         anti-inflammatory drugs for 5 days after polyp removal.                        - Return to referring physician as previously  scheduled.                        - The findings and recommendations were discussed with                         the patient. Procedure Code(s):     --- Professional ---                        [phone], Colonoscopy, flexible; with removal of                         tumor(s), polyp(s), or other lesion(s) by snare                         technique Diagnosis Code(s):     --- Professional ---                        Z86.010, Personal history of colonic polyps                        K64.0, First degree hemorrhoids                        D12.4, Benign neoplasm of descending colon                        K62.89, Other specified diseases of anus and rectum CPT copyright 2022 [employer]. All rights reserved. The codes documented in this report are preliminary and upon coder review may  be revised to meet current compliance requirements. Attending Participation:      I personally performed the entire procedure. Elfredia Nevins, DO Jaynie Collins DO, DO 08/01/2023 1:48:38 PM This report has been signed electronically. Number of Addenda: 0 Note Initiated On: 08/01/2023 1:03 PM Scope Withdrawal Time: 0 hours 16 minutes 59 seconds  Total Procedure Duration: 0 hours 21 minutes 4 seconds  Estimated Blood Loss:  Estimated blood loss was minimal.      [facility]

## 2023-08-01 NOTE — Transfer of Care (Signed)
Immediate Anesthesia Transfer of Care Note  Patient: Consuella Lose  Procedure(s) Performed: COLONOSCOPY WITH PROPOFOL  Patient Location: PACU  Anesthesia Type:General  Level of Consciousness: awake, alert , and patient cooperative  Airway & Oxygen Therapy: Patient Spontanous Breathing  Post-op Assessment: Report given to RN and Post -op Vital signs reviewed and stable  Post vital signs: stable  Last Vitals:  Vitals Value Taken Time  BP    Temp    Pulse    Resp 16 08/01/23 1345  SpO2    Vitals shown include unfiled device data.  Last Pain:  Vitals:   08/01/23 1217  TempSrc: Tympanic  PainSc: 0-No pain         Complications: No notable events documented.

## 2023-08-01 NOTE — Interval H&P Note (Signed)
History and Physical Interval Note: Preprocedure H&P from 08/01/23  was reviewed and there was no interval change after seeing and examining the patient.  Written consent was obtained from the patient after discussion of risks, benefits, and alternatives. Patient has consented to proceed with Colonoscopy with possible intervention   08/01/2023 12:36 PM  Crystal Hunt  has presented today for surgery, with the diagnosis of V12.72 (ICD-9-CM) - Z86.010 (ICD-10-CM) - Personal history of colonic polyps569.3 (ICD-9-CM) - K62.5 (ICD-10-CM) - Rectal bleeding.  The various methods of treatment have been discussed with the patient and family. After consideration of risks, benefits and other options for treatment, the patient has consented to  Procedure(s): COLONOSCOPY WITH PROPOFOL (N/A) as a surgical intervention.  The patient's history has been reviewed, patient examined, no change in status, stable for surgery.  I have reviewed the patient's chart and labs.  Questions were answered to the patient's satisfaction.     Jaynie Collins

## 2023-08-01 NOTE — Anesthesia Preprocedure Evaluation (Signed)
Anesthesia Evaluation  Patient identified by MRN, date of birth, ID band Patient awake    Reviewed: Allergy & Precautions, NPO status , Patient's Chart, lab work & pertinent test results  History of Anesthesia Complications Negative for: history of anesthetic complications  Airway Mallampati: II       Dental  (+) Dental Advidsory Given, Teeth Intact   Pulmonary neg shortness of breath, neg sleep apnea, neg COPD, neg recent URI, Not current smoker   Pulmonary exam normal        Cardiovascular Exercise Tolerance: Good hypertension, Pt. on medications (-) angina (-) Past MI, (-) Cardiac Stents and (-) CHF Normal cardiovascular exam(-) dysrhythmias (-) Valvular Problems/Murmurs     Neuro/Psych neg Seizures    GI/Hepatic Neg liver ROS,neg GERD  ,,  Endo/Other  neg diabetesHypothyroidism    Renal/GU negative Renal ROS     Musculoskeletal   Abdominal   Peds  Hematology  (+) Blood dyscrasia, anemia   Anesthesia Other Findings Past Medical History: No date: Anemia No date: High cholesterol No date: Hypertension No date: Thyroid disease   Reproductive/Obstetrics                             Anesthesia Physical Anesthesia Plan  ASA: 2  Anesthesia Plan: General   Post-op Pain Management:    Induction: Intravenous  PONV Risk Score and Plan: 3 and Propofol infusion, TIVA and Treatment may vary due to age or medical condition  Airway Management Planned: Nasal Cannula  Additional Equipment:   Intra-op Plan:   Post-operative Plan:   Informed Consent: I have reviewed the patients History and Physical, chart, labs and discussed the procedure including the risks, benefits and alternatives for the proposed anesthesia with the patient or authorized representative who has indicated his/her understanding and acceptance.       Plan Discussed with:   Anesthesia Plan Comments:          Anesthesia Quick Evaluation

## 2023-08-02 ENCOUNTER — Encounter: Payer: Self-pay | Admitting: Gastroenterology

## 2023-08-10 NOTE — Anesthesia Postprocedure Evaluation (Signed)
Anesthesia Post Note  Patient: Crystal Hunt  Procedure(s) Performed: COLONOSCOPY WITH PROPOFOL POLYPECTOMY  Patient location during evaluation: Endoscopy Anesthesia Type: General Level of consciousness: awake and alert Pain management: pain level controlled Vital Signs Assessment: post-procedure vital signs reviewed and stable Respiratory status: spontaneous breathing, nonlabored ventilation, respiratory function stable and patient connected to nasal cannula oxygen Cardiovascular status: blood pressure returned to baseline and stable Postop Assessment: no apparent nausea or vomiting Anesthetic complications: no   There were no known notable events for this encounter.   Last Vitals:  Vitals:   08/01/23 1356 08/01/23 1404  BP:  (!) 142/79  Pulse: 88 86  Resp:    Temp:    SpO2:  100%    Last Pain:  Vitals:   08/02/23 0732  TempSrc:   PainSc: 0-No pain                 Lenard Simmer

## 2023-08-12 ENCOUNTER — Ambulatory Visit: Admission: RE | Admit: 2023-08-12 | Payer: 59 | Source: Home / Self Care

## 2023-08-12 ENCOUNTER — Encounter: Admission: RE | Payer: Self-pay | Source: Home / Self Care

## 2023-08-12 SURGERY — COLONOSCOPY WITH PROPOFOL
Anesthesia: General

## 2023-09-14 DIAGNOSIS — E119 Type 2 diabetes mellitus without complications: Secondary | ICD-10-CM | POA: Diagnosis not present

## 2023-09-21 DIAGNOSIS — E119 Type 2 diabetes mellitus without complications: Secondary | ICD-10-CM | POA: Diagnosis not present

## 2023-09-21 DIAGNOSIS — M810 Age-related osteoporosis without current pathological fracture: Secondary | ICD-10-CM | POA: Diagnosis not present

## 2023-09-21 DIAGNOSIS — Z23 Encounter for immunization: Secondary | ICD-10-CM | POA: Diagnosis not present

## 2023-09-21 DIAGNOSIS — E039 Hypothyroidism, unspecified: Secondary | ICD-10-CM | POA: Diagnosis not present

## 2023-09-21 DIAGNOSIS — I10 Essential (primary) hypertension: Secondary | ICD-10-CM | POA: Diagnosis not present

## 2023-09-21 DIAGNOSIS — N870 Mild cervical dysplasia: Secondary | ICD-10-CM | POA: Diagnosis not present

## 2023-09-28 DIAGNOSIS — R87611 Atypical squamous cells cannot exclude high grade squamous intraepithelial lesion on cytologic smear of cervix (ASC-H): Secondary | ICD-10-CM | POA: Diagnosis not present

## 2023-09-28 DIAGNOSIS — R87613 High grade squamous intraepithelial lesion on cytologic smear of cervix (HGSIL): Secondary | ICD-10-CM | POA: Diagnosis not present

## 2023-09-28 DIAGNOSIS — M8588 Other specified disorders of bone density and structure, other site: Secondary | ICD-10-CM | POA: Diagnosis not present

## 2023-10-14 DIAGNOSIS — R87613 High grade squamous intraepithelial lesion on cytologic smear of cervix (HGSIL): Secondary | ICD-10-CM | POA: Diagnosis not present

## 2023-10-14 DIAGNOSIS — R87619 Unspecified abnormal cytological findings in specimens from cervix uteri: Secondary | ICD-10-CM | POA: Diagnosis not present

## 2024-01-26 ENCOUNTER — Other Ambulatory Visit: Payer: Self-pay | Admitting: Internal Medicine

## 2024-01-26 DIAGNOSIS — Z1231 Encounter for screening mammogram for malignant neoplasm of breast: Secondary | ICD-10-CM

## 2024-02-28 ENCOUNTER — Ambulatory Visit
Admission: RE | Admit: 2024-02-28 | Discharge: 2024-02-28 | Disposition: A | Discharge status: Home / Self Care | Payer: 59 | Source: Ambulatory Visit | Attending: Internal Medicine | Admitting: Internal Medicine

## 2024-02-28 DIAGNOSIS — Z1231 Encounter for screening mammogram for malignant neoplasm of breast: Secondary | ICD-10-CM | POA: Diagnosis not present

## 2024-03-20 DIAGNOSIS — E119 Type 2 diabetes mellitus without complications: Secondary | ICD-10-CM | POA: Diagnosis not present

## 2024-03-20 DIAGNOSIS — E039 Hypothyroidism, unspecified: Secondary | ICD-10-CM | POA: Diagnosis not present

## 2024-03-27 DIAGNOSIS — Z0000 Encounter for general adult medical examination without abnormal findings: Secondary | ICD-10-CM | POA: Diagnosis not present

## 2024-03-27 DIAGNOSIS — M810 Age-related osteoporosis without current pathological fracture: Secondary | ICD-10-CM | POA: Diagnosis not present

## 2024-03-27 DIAGNOSIS — N870 Mild cervical dysplasia: Secondary | ICD-10-CM | POA: Diagnosis not present

## 2024-03-27 DIAGNOSIS — E039 Hypothyroidism, unspecified: Secondary | ICD-10-CM | POA: Diagnosis not present

## 2024-03-27 DIAGNOSIS — I10 Essential (primary) hypertension: Secondary | ICD-10-CM | POA: Diagnosis not present

## 2024-03-27 DIAGNOSIS — E119 Type 2 diabetes mellitus without complications: Secondary | ICD-10-CM | POA: Diagnosis not present

## 2024-04-25 DIAGNOSIS — R6882 Decreased libido: Secondary | ICD-10-CM | POA: Diagnosis not present

## 2024-04-25 DIAGNOSIS — R87610 Atypical squamous cells of undetermined significance on cytologic smear of cervix (ASC-US): Secondary | ICD-10-CM | POA: Diagnosis not present

## 2024-04-25 DIAGNOSIS — R87613 High grade squamous intraepithelial lesion on cytologic smear of cervix (HGSIL): Secondary | ICD-10-CM | POA: Diagnosis not present

## 2024-09-21 DIAGNOSIS — E119 Type 2 diabetes mellitus without complications: Secondary | ICD-10-CM | POA: Diagnosis not present

## 2024-09-21 DIAGNOSIS — E039 Hypothyroidism, unspecified: Secondary | ICD-10-CM | POA: Diagnosis not present

## 2024-09-28 DIAGNOSIS — R7989 Other specified abnormal findings of blood chemistry: Secondary | ICD-10-CM | POA: Diagnosis not present

## 2024-09-28 DIAGNOSIS — E039 Hypothyroidism, unspecified: Secondary | ICD-10-CM | POA: Diagnosis not present

## 2024-09-28 DIAGNOSIS — Z23 Encounter for immunization: Secondary | ICD-10-CM | POA: Diagnosis not present

## 2024-09-28 DIAGNOSIS — N870 Mild cervical dysplasia: Secondary | ICD-10-CM | POA: Diagnosis not present

## 2024-09-28 DIAGNOSIS — E119 Type 2 diabetes mellitus without complications: Secondary | ICD-10-CM | POA: Diagnosis not present

## 2024-09-28 DIAGNOSIS — I10 Essential (primary) hypertension: Secondary | ICD-10-CM | POA: Diagnosis not present
